# Patient Record
Sex: Male | Born: 1960
Health system: Southern US, Community
[De-identification: ages and names within clinical notes are randomized; demographics above are authoritative.]

## PROBLEM LIST (undated history)

## (undated) DIAGNOSIS — F1020 Alcohol dependence, uncomplicated: Secondary | ICD-10-CM

## (undated) DIAGNOSIS — K7030 Alcoholic cirrhosis of liver without ascites: Secondary | ICD-10-CM

## (undated) DIAGNOSIS — I10 Essential (primary) hypertension: Secondary | ICD-10-CM

## (undated) DIAGNOSIS — M109 Gout, unspecified: Secondary | ICD-10-CM

## (undated) DIAGNOSIS — G621 Alcoholic polyneuropathy: Secondary | ICD-10-CM

## (undated) DIAGNOSIS — Z87891 Personal history of nicotine dependence: Secondary | ICD-10-CM

## (undated) DIAGNOSIS — G4733 Obstructive sleep apnea (adult) (pediatric): Secondary | ICD-10-CM

## (undated) DIAGNOSIS — M1990 Unspecified osteoarthritis, unspecified site: Secondary | ICD-10-CM

## (undated) DIAGNOSIS — F32A Depression, unspecified: Secondary | ICD-10-CM

## (undated) DIAGNOSIS — Z5320 Procedure and treatment not carried out because of patient's decision for unspecified reasons: Secondary | ICD-10-CM

## (undated) DIAGNOSIS — F1010 Alcohol abuse, uncomplicated: Secondary | ICD-10-CM

## (undated) DIAGNOSIS — F329 Major depressive disorder, single episode, unspecified: Secondary | ICD-10-CM

## (undated) DIAGNOSIS — F419 Anxiety disorder, unspecified: Secondary | ICD-10-CM

## (undated) DIAGNOSIS — N183 Chronic kidney disease, stage 3 unspecified: Secondary | ICD-10-CM

## (undated) DIAGNOSIS — R269 Unspecified abnormalities of gait and mobility: Secondary | ICD-10-CM

## (undated) DIAGNOSIS — Z91014 Allergy to mammalian meats: Secondary | ICD-10-CM

## (undated) HISTORY — DX: Personal history of nicotine dependence: Z87.891

## (undated) HISTORY — DX: Alcoholic cirrhosis of liver without ascites: K70.30

## (undated) HISTORY — DX: Obstructive sleep apnea (adult) (pediatric): G47.33

## (undated) HISTORY — DX: Unspecified osteoarthritis, unspecified site: M19.90

## (undated) HISTORY — DX: Allergy to mammalian meats: Z91.014

## (undated) HISTORY — DX: Chronic kidney disease, stage 3 unspecified: N18.30

## (undated) HISTORY — DX: Alcoholic polyneuropathy: G62.1

## (undated) HISTORY — DX: Unspecified abnormalities of gait and mobility: R26.9

## (undated) HISTORY — DX: Procedure and treatment not carried out because of patient's decision for unspecified reasons: Z53.20

## (undated) HISTORY — DX: Alcohol dependence, uncomplicated: F10.20

## (undated) HISTORY — PX: HERNIA REPAIR: SHX51

---

## 2000-05-13 ENCOUNTER — Ambulatory Visit (HOSPITAL_COMMUNITY): Admission: RE | Admit: 2000-05-13 | Discharge: 2000-05-13 | Payer: Self-pay | Admitting: General Surgery

## 2003-07-09 ENCOUNTER — Encounter: Payer: Self-pay | Admitting: Family Medicine

## 2003-07-09 ENCOUNTER — Ambulatory Visit (HOSPITAL_COMMUNITY): Admission: RE | Admit: 2003-07-09 | Discharge: 2003-07-09 | Payer: Self-pay | Admitting: Family Medicine

## 2004-08-05 ENCOUNTER — Other Ambulatory Visit: Payer: Self-pay

## 2007-10-30 ENCOUNTER — Ambulatory Visit: Payer: Self-pay | Admitting: Family Medicine

## 2010-02-21 ENCOUNTER — Ambulatory Visit: Payer: Self-pay | Admitting: Family Medicine

## 2015-06-05 ENCOUNTER — Emergency Department: Payer: BLUE CROSS/BLUE SHIELD

## 2015-06-05 ENCOUNTER — Inpatient Hospital Stay
Admission: EM | Admit: 2015-06-05 | Discharge: 2015-06-06 | DRG: 897 | Disposition: A | Discharge status: Home / Self Care | Payer: BLUE CROSS/BLUE SHIELD | Attending: Internal Medicine | Admitting: Internal Medicine

## 2015-06-05 ENCOUNTER — Encounter: Payer: Self-pay | Admitting: *Deleted

## 2015-06-05 DIAGNOSIS — F17210 Nicotine dependence, cigarettes, uncomplicated: Secondary | ICD-10-CM | POA: Diagnosis present

## 2015-06-05 DIAGNOSIS — I10 Essential (primary) hypertension: Secondary | ICD-10-CM | POA: Diagnosis present

## 2015-06-05 DIAGNOSIS — I160 Hypertensive urgency: Secondary | ICD-10-CM

## 2015-06-05 DIAGNOSIS — Z79899 Other long term (current) drug therapy: Secondary | ICD-10-CM

## 2015-06-05 DIAGNOSIS — F419 Anxiety disorder, unspecified: Secondary | ICD-10-CM | POA: Diagnosis present

## 2015-06-05 DIAGNOSIS — F10939 Alcohol use, unspecified with withdrawal, unspecified: Secondary | ICD-10-CM | POA: Diagnosis present

## 2015-06-05 DIAGNOSIS — I159 Secondary hypertension, unspecified: Secondary | ICD-10-CM

## 2015-06-05 DIAGNOSIS — Z716 Tobacco abuse counseling: Secondary | ICD-10-CM | POA: Diagnosis present

## 2015-06-05 DIAGNOSIS — F10230 Alcohol dependence with withdrawal, uncomplicated: Secondary | ICD-10-CM

## 2015-06-05 DIAGNOSIS — F10231 Alcohol dependence with withdrawal delirium: Principal | ICD-10-CM | POA: Diagnosis present

## 2015-06-05 DIAGNOSIS — E876 Hypokalemia: Secondary | ICD-10-CM | POA: Diagnosis present

## 2015-06-05 DIAGNOSIS — R7989 Other specified abnormal findings of blood chemistry: Secondary | ICD-10-CM | POA: Diagnosis present

## 2015-06-05 DIAGNOSIS — Z9889 Other specified postprocedural states: Secondary | ICD-10-CM | POA: Diagnosis not present

## 2015-06-05 DIAGNOSIS — F10930 Alcohol use, unspecified with withdrawal, uncomplicated: Secondary | ICD-10-CM

## 2015-06-05 DIAGNOSIS — F10239 Alcohol dependence with withdrawal, unspecified: Secondary | ICD-10-CM | POA: Diagnosis present

## 2015-06-05 HISTORY — DX: Essential (primary) hypertension: I10

## 2015-06-05 HISTORY — DX: Anxiety disorder, unspecified: F41.9

## 2015-06-05 LAB — COMPREHENSIVE METABOLIC PANEL
ALBUMIN: 4.7 g/dL (ref 3.5–5.0)
ALK PHOS: 72 U/L (ref 38–126)
ALT: 58 U/L (ref 17–63)
AST: 83 U/L — AB (ref 15–41)
Anion gap: 11 (ref 5–15)
BUN: 15 mg/dL (ref 6–20)
CO2: 25 mmol/L (ref 22–32)
CREATININE: 1.03 mg/dL (ref 0.61–1.24)
Calcium: 8.5 mg/dL — ABNORMAL LOW (ref 8.9–10.3)
Chloride: 104 mmol/L (ref 101–111)
GFR calc Af Amer: >60 mL/min (ref >60)
Glucose, Bld: 95 mg/dL (ref 65–99)
POTASSIUM: 3.1 mmol/L — AB (ref 3.5–5.1)
Sodium: 140 mmol/L (ref 135–145)
Total Bilirubin: 2.2 mg/dL — ABNORMAL HIGH (ref 0.3–1.2)
Total Protein: 7.8 g/dL (ref 6.5–8.1)

## 2015-06-05 LAB — URINE DRUG SCREEN, QUALITATIVE (ARMC ONLY)
AMPHETAMINES, UR SCREEN: NOT DETECTED
Barbiturates, Ur Screen: NOT DETECTED
Benzodiazepine, Ur Scrn: NOT DETECTED
Cannabinoid 50 Ng, Ur ~~LOC~~: NOT DETECTED
Cocaine Metabolite,Ur ~~LOC~~: NOT DETECTED
MDMA (Ecstasy)Ur Screen: NOT DETECTED
Methadone Scn, Ur: NOT DETECTED
OPIATE, UR SCREEN: NOT DETECTED
Phencyclidine (PCP) Ur S: NOT DETECTED
TRICYCLIC, UR SCREEN: NOT DETECTED

## 2015-06-05 LAB — URINALYSIS COMPLETE WITH MICROSCOPIC (ARMC ONLY)
BILIRUBIN URINE: NEGATIVE
Bacteria, UA: NONE SEEN
Glucose, UA: NEGATIVE mg/dL
HGB URINE DIPSTICK: NEGATIVE
Leukocytes, UA: NEGATIVE
Nitrite: NEGATIVE
PH: 7 (ref 5.0–8.0)
Protein, ur: NEGATIVE mg/dL
SPECIFIC GRAVITY, URINE: 1.011 (ref 1.005–1.030)
Squamous Epithelial / LPF: NONE SEEN

## 2015-06-05 LAB — PHOSPHORUS: Phosphorus: 1.3 mg/dL — ABNORMAL LOW (ref 2.5–4.6)

## 2015-06-05 LAB — LIPASE, BLOOD: Lipase: 60 U/L — ABNORMAL HIGH (ref 22–51)

## 2015-06-05 LAB — CBC
HEMATOCRIT: 58 % — AB (ref 40.0–52.0)
HEMOGLOBIN: 20.1 g/dL — AB (ref 13.0–18.0)
MCH: 33.6 pg (ref 26.0–34.0)
MCHC: 34.7 g/dL (ref 32.0–36.0)
MCV: 96.8 fL (ref 80.0–100.0)
Platelets: 160 10*3/uL (ref 150–440)
RBC: 5.99 MIL/uL — ABNORMAL HIGH (ref 4.40–5.90)
RDW: 13.6 % (ref 11.5–14.5)
WBC: 7.3 10*3/uL (ref 3.8–10.6)

## 2015-06-05 LAB — TROPONIN I: Troponin I: <0.03 ng/mL (ref <0.031)

## 2015-06-05 LAB — MAGNESIUM: Magnesium: 1.7 mg/dL (ref 1.7–2.4)

## 2015-06-05 LAB — SALICYLATE LEVEL: Salicylate Lvl: <4 mg/dL (ref 2.8–30.0)

## 2015-06-05 LAB — ETHANOL: ALCOHOL ETHYL (B): 109 mg/dL — AB (ref <5)

## 2015-06-05 LAB — MRSA PCR SCREENING: MRSA BY PCR: NEGATIVE

## 2015-06-05 LAB — ACETAMINOPHEN LEVEL

## 2015-06-05 MED ORDER — SODIUM CHLORIDE 0.9 % IV SOLN
INTRAVENOUS | Status: DC
  Administered 2015-06-05: 09:00:00 via INTRAVENOUS

## 2015-06-05 MED ORDER — LORAZEPAM 2 MG/ML IJ SOLN
1.0000 mg | Freq: Once | INTRAMUSCULAR | Status: AC
  Administered 2015-06-05: 1 mg via INTRAVENOUS
  Filled 2015-06-05: qty 1

## 2015-06-05 MED ORDER — LISINOPRIL-HYDROCHLOROTHIAZIDE 20-12.5 MG PO TABS: 1.0000 | ORAL_TABLET | Freq: Every day | ORAL | Status: DC

## 2015-06-05 MED ORDER — LORAZEPAM 2 MG/ML IJ SOLN
INTRAMUSCULAR | Status: AC
  Administered 2015-06-05: 1 mg via INTRAVENOUS
  Filled 2015-06-05: qty 1

## 2015-06-05 MED ORDER — HYDROCHLOROTHIAZIDE 12.5 MG PO CAPS
12.5000 mg | ORAL_CAPSULE | Freq: Every day | ORAL | Status: DC
  Administered 2015-06-05 – 2015-06-06 (×2): 12.5 mg via ORAL
  Filled 2015-06-05 (×2): qty 1

## 2015-06-05 MED ORDER — LABETALOL HCL 5 MG/ML IV SOLN
20.0000 mg | INTRAVENOUS | Status: DC | PRN
  Administered 2015-06-05 – 2015-06-06 (×4): 20 mg via INTRAVENOUS
  Filled 2015-06-05 (×4): qty 4

## 2015-06-05 MED ORDER — ONDANSETRON HCL 4 MG/2ML IJ SOLN
4.0000 mg | Freq: Four times a day (QID) | INTRAMUSCULAR | Status: DC | PRN
  Administered 2015-06-05: 4 mg via INTRAVENOUS
  Filled 2015-06-05: qty 2

## 2015-06-05 MED ORDER — HYDRALAZINE HCL 20 MG/ML IJ SOLN: 10.0000 mg | Freq: Four times a day (QID) | INTRAMUSCULAR | Status: DC | PRN

## 2015-06-05 MED ORDER — ALBUTEROL SULFATE (2.5 MG/3ML) 0.083% IN NEBU: 2.5000 mg | INHALATION_SOLUTION | RESPIRATORY_TRACT | Status: DC | PRN

## 2015-06-05 MED ORDER — THIAMINE HCL 100 MG/ML IJ SOLN
Freq: Once | INTRAVENOUS | Status: AC
  Administered 2015-06-05: 07:00:00 via INTRAVENOUS
  Filled 2015-06-05: qty 1000

## 2015-06-05 MED ORDER — HEPARIN SODIUM (PORCINE) 5000 UNIT/ML IJ SOLN
5000.0000 [IU] | Freq: Three times a day (TID) | INTRAMUSCULAR | Status: DC
  Administered 2015-06-05 – 2015-06-06 (×4): 5000 [IU] via SUBCUTANEOUS
  Filled 2015-06-05 (×4): qty 1

## 2015-06-05 MED ORDER — LORAZEPAM 2 MG/ML IJ SOLN
1.0000 mg | Freq: Once | INTRAMUSCULAR | Status: AC
  Administered 2015-06-05: 1 mg via INTRAVENOUS

## 2015-06-05 MED ORDER — POTASSIUM CHLORIDE CRYS ER 20 MEQ PO TBCR
40.0000 meq | EXTENDED_RELEASE_TABLET | Freq: Once | ORAL | Status: AC
  Administered 2015-06-05: 40 meq via ORAL
  Filled 2015-06-05: qty 2

## 2015-06-05 MED ORDER — HYDRALAZINE HCL 20 MG/ML IJ SOLN
10.0000 mg | Freq: Once | INTRAMUSCULAR | Status: AC
  Administered 2015-06-05: 10 mg via INTRAVENOUS

## 2015-06-05 MED ORDER — HYDRALAZINE HCL 20 MG/ML IJ SOLN
INTRAMUSCULAR | Status: AC
  Administered 2015-06-05: 10 mg via INTRAVENOUS
  Filled 2015-06-05: qty 1

## 2015-06-05 MED ORDER — ACETAMINOPHEN 325 MG PO TABS
650.0000 mg | ORAL_TABLET | Freq: Four times a day (QID) | ORAL | Status: DC | PRN
  Administered 2015-06-05 – 2015-06-06 (×2): 650 mg via ORAL
  Filled 2015-06-05 (×2): qty 2

## 2015-06-05 MED ORDER — CLONIDINE HCL 0.1 MG PO TABS
0.1000 mg | ORAL_TABLET | Freq: Once | ORAL | Status: AC
  Administered 2015-06-05: 0.1 mg via ORAL
  Filled 2015-06-05: qty 1

## 2015-06-05 MED ORDER — THIAMINE HCL 100 MG/ML IJ SOLN
Freq: Once | INTRAVENOUS | Status: AC
  Administered 2015-06-05: 09:00:00 via INTRAVENOUS
  Filled 2015-06-05 (×2): qty 1000

## 2015-06-05 MED ORDER — ONDANSETRON HCL 4 MG PO TABS: 4.0000 mg | ORAL_TABLET | Freq: Four times a day (QID) | ORAL | Status: DC | PRN

## 2015-06-05 MED ORDER — NITROGLYCERIN 2 % TD OINT
1.0000 [in_us] | TOPICAL_OINTMENT | Freq: Four times a day (QID) | TRANSDERMAL | Status: DC
  Administered 2015-06-05 – 2015-06-06 (×5): 1 [in_us] via TOPICAL
  Filled 2015-06-05 (×5): qty 1

## 2015-06-05 MED ORDER — ACETAMINOPHEN 650 MG RE SUPP: 650.0000 mg | Freq: Four times a day (QID) | RECTAL | Status: DC | PRN

## 2015-06-05 MED ORDER — LORAZEPAM 2 MG/ML IJ SOLN
0.0000 mg | Freq: Four times a day (QID) | INTRAMUSCULAR | Status: DC
  Administered 2015-06-05: 1 mg via INTRAVENOUS
  Filled 2015-06-05: qty 1

## 2015-06-05 MED ORDER — CLONIDINE HCL 0.1 MG PO TABS
0.1000 mg | ORAL_TABLET | Freq: Two times a day (BID) | ORAL | Status: DC
  Administered 2015-06-05 – 2015-06-06 (×3): 0.1 mg via ORAL
  Filled 2015-06-05 (×3): qty 1

## 2015-06-05 MED ORDER — LISINOPRIL 20 MG PO TABS
20.0000 mg | ORAL_TABLET | Freq: Every day | ORAL | Status: DC
  Administered 2015-06-05 – 2015-06-06 (×2): 20 mg via ORAL
  Filled 2015-06-05 (×2): qty 1

## 2015-06-05 MED ORDER — SODIUM CHLORIDE 0.9 % IV BOLUS (SEPSIS)
500.0000 mL | Freq: Once | INTRAVENOUS | Status: AC
  Administered 2015-06-05: 500 mL via INTRAVENOUS

## 2015-06-05 MED ORDER — ONDANSETRON HCL 4 MG/2ML IJ SOLN
4.0000 mg | Freq: Once | INTRAMUSCULAR | Status: AC
  Administered 2015-06-05: 4 mg via INTRAVENOUS
  Filled 2015-06-05: qty 2

## 2015-06-05 NOTE — ED Notes (Signed)
Admitting provider at bedside.

## 2015-06-05 NOTE — ED Notes (Signed)
Pt presents w/ c/o generally feeling back since yesterday morning. Pt is very shaky and flushed. Pt admits to not taking meds for his HTN this week. Pt admits to daily, excessive ETOH use. Pt c/o dizziness and nausea. Pt smells of ETOH but states he has not had any to drink since noon yesterday.

## 2015-06-05 NOTE — ED Provider Notes (Signed)
[facility] Emergency Department Provider Note  ____________________________________________  Time seen: Approximately 3:41 AM  I have reviewed the triage vital signs and the nursing notes.   HISTORY  Chief Complaint Hypertension and Alcohol Intoxication  History obtained by patient and spouse  HPI Kenneth Trevino is a 54 y.o. male who presents to the ED from home with complaints of generalized myalgia, feeling shaky and flushed, elevated blood pressure. Patient states "I just feel bad" times several days. Admits to not taking his blood pressure meds this week. Complains of dizziness and nausea. Admits to daily alcohol use; last use yesterday afternoon. Does not feel his alcohol use is an issue;however, spouse states over the past 2 weeks that patient has had labile mood swings and seems depressed. Patient denies SI/HI/AH/VH. Denies history of DTs. Admits to increased daily stressors. Complains of gradual onset, generalized headache, blurry vision, generalized malaise and nausea. Denies chest pain, shortness of breath, vomiting, diarrhea.   Past Medical History  Diagnosis Date  . Hypertension   . Anxiety     There are no active problems to display for this patient.   Past Surgical History  Procedure Laterality Date  . Hernia repair      Current Outpatient Rx  Name  Route  Sig  Dispense  Refill  . cloNIDine (CATAPRES) 0.1 MG tablet   Oral   Take 0.1 mg by mouth 2 (two) times daily.         Marland Kitchen. lisinopril-hydrochlorothiazide (PRINZIDE,ZESTORETIC) 20-12.5 MG per tablet   Oral   Take 1 tablet by mouth daily.           Allergies Review of patient's allergies indicates no known allergies.  History reviewed. No pertinent family history.  Social History History  Substance Use Topics  . Smoking status: Current Some Day Smoker    Types: Cigarettes  . Smokeless tobacco: Former NeurosurgeonUser  . Alcohol Use: Yes     Comment: daily, consumes 2 half-gallons  of liquor a week  Denies illicit drug use  Review of Systems Constitutional: No fever/chills. Positive for generalized malaise. Eyes: Positive for visual changes. ENT: No sore throat. Cardiovascular: Denies chest pain. Respiratory: Denies shortness of breath. Gastrointestinal: No abdominal pain.  No nausea, no vomiting.  No diarrhea.  No constipation. Genitourinary: Negative for dysuria. Musculoskeletal: Negative for back pain. Skin: Negative for rash. Neurological: Positive for headaches. Negative for focal weakness or numbness. Psychiatric:Positive for increased anxiety and stressors.  10-point ROS otherwise negative.  ____________________________________________   PHYSICAL EXAM:  VITAL SIGNS: ED Triage Vitals  Enc Vitals Group     BP 06/05/15 0256 190/105 mmHg     Pulse Rate 06/05/15 0256 99     Resp 06/05/15 0256 20     Temp 06/05/15 0256 97.6 F (36.4 C)     Temp Source 06/05/15 0256 Oral     SpO2 06/05/15 0256 95 %     Weight 06/05/15 0256 235 lb (106.595 kg)     Height 06/05/15 0256 6' (1.829 m)     Head Cir --      Peak Flow --      Pain Score 06/05/15 0257 5     Pain Loc --      Pain Edu? --      Excl. in GC? --     Constitutional: Alert and oriented. Well appearing and in mild acute distress. Eyes: Conjunctivae are normal. PERRL. EOMI. Head: Atraumatic. Nose: No congestion/rhinnorhea. Mouth/Throat: Mucous membranes are moist.  Oropharynx  non-erythematous. Neck: No stridor.   Cardiovascular: Normal rate, regular rhythm. Grossly normal heart sounds.  Good peripheral circulation. Respiratory: Normal respiratory effort.  No retractions. Lungs CTAB. Gastrointestinal: Soft and nontender. No distention. No abdominal bruits. No CVA tenderness. Musculoskeletal: No lower extremity tenderness nor edema.  No joint effusions. Neurologic:  Normal speech and language. No gross focal neurologic deficits are appreciated. No gait instability. Skin:  Skin is warm, dry  and intact. No rash noted. Psychiatric: Mood and affect are flat. Speech and behavior are normal.  ____________________________________________   LABS (all labs ordered are listed, but only abnormal results are displayed)  Labs Reviewed  COMPREHENSIVE METABOLIC PANEL - Abnormal; Notable for the following:    Potassium 3.1 (*)    Calcium 8.5 (*)    AST 83 (*)    Total Bilirubin 2.2 (*)    All other components within normal limits  ETHANOL - Abnormal; Notable for the following:    Alcohol, Ethyl (B) 109 (*)    All other components within normal limits  ACETAMINOPHEN LEVEL - Abnormal; Notable for the following:    Acetaminophen (Tylenol), Serum <10 (*)    All other components within normal limits  CBC - Abnormal; Notable for the following:    RBC 5.99 (*)    Hemoglobin 20.1 (*)    HCT 58.0 (*)    All other components within normal limits  LIPASE, BLOOD - Abnormal; Notable for the following:    Lipase 60 (*)    All other components within normal limits  SALICYLATE LEVEL  TROPONIN I  URINE DRUG SCREEN, QUALITATIVE ([facility] ONLY)  CBG MONITORING, ED   ____________________________________________  EKG  ED ECG REPORT I, Kristain Hu J, the attending physician, personally viewed and interpreted this ECG.   Date: 06/05/2015  EKG Time: 0254  Rate: 96  Rhythm: normal EKG, normal sinus rhythm  Axis: Normal  Intervals:none  ST&T Change: Nonspecific  ____________________________________________  RADIOLOGY  CT head without contrast interpreted per Dr. Andria Meuse: No acute intracranial abnormalities. ____________________________________________   PROCEDURES  Procedure(s) performed: None  Critical Care performed:  CRITICAL CARE Performed by: Irean Hong   Total critical care time: 30 minutes  Critical care time was exclusive of separately billable procedures and treating other patients.  Critical care was necessary to treat or prevent imminent or life-threatening  deterioration.  Critical care was time spent personally by me on the following activities: development of treatment plan with patient and/or surrogate as well as nursing, discussions with consultants, evaluation of patient's response to treatment, examination of patient, obtaining history from patient or surrogate, ordering and performing treatments and interventions, ordering and review of laboratory studies, ordering and review of radiographic studies, pulse oximetry and re-evaluation of patient's condition.  ____________________________________________   INITIAL IMPRESSION / ASSESSMENT AND PLAN / ED COURSE  Pertinent labs & imaging results that were available during my care of the patient were reviewed by me and considered in my medical decision making (see chart for details).  54 year old male who presents with elevated blood pressure, generalized malaise, headache associated with blurry vision. I had a frank discussion with the patient and his wife who feels he warrants an evaluation with behavioral medicine. Patient is agreeable to a TTS consultation while he is in the emergency department. Will administer IV Ativan to help with anxiety as well as blood pressure. Obtain noncontrast CT head to evaluate for intracranial hemorrhage. Await screening laboratory and urinalysis results.  ----------------------------------------- 5:34 AM on 06/05/2015 -----------------------------------------  Patient was seen  by TTS who provided referrals for the patient and his spouse. No improvement after IV Ativan. Patient is still extremely hypertensive. Patient did state the Ativan did help "calm his nerves" a little bit. Will repeat IV bolus of Ativan as well as IV hydralazine for blood pressure control.  ----------------------------------------- 6:20 AM on 06/05/2015 -----------------------------------------  Blood pressure initially down to 193/103, but has crept back up. Current BP 211/117. Feel  patient's symptoms and extremely elevated blood pressure likely a combination of alcohol withdrawal along with hypertensive urgency. Will continue with IV Ativan and antihypertensives. Will discuss with hospitalist for evaluation in the ED for hospital admission.   ____________________________________________   FINAL CLINICAL IMPRESSION(S) / ED DIAGNOSES  Final diagnoses:  Secondary hypertension, unspecified  Alcohol withdrawal, uncomplicated  Hypertensive urgency      Irean Hong, MD 06/05/15 1610

## 2015-06-05 NOTE — BH Assessment (Signed)
Assessment Note  Kenneth Trevino is an 54 y.o. male who presents to ED with c/o of high blood pressure. Pt states that has not taken his blood pressure medication for the past 7 days. Pt denies SI/HI, as well as any self-injurious behaviors, hallucinations and delusions. Pt. denies any history of abuse.   Pt. was somewhat defensive and evasive when TTS inquired about alcohol usage expressing "everyone thinks because you drink that that causes all your problems". Pt. did report that hey typically drinks a 5th of liquor a day "when I can get away with it". Pt. reported last alcohol consumption to be "at dinner" (5th of vodka).   Pt. reports that he was diagnosed with anxiety 5 years ago for which he was prescribed Paxil. Pt. shared that the Paxil worked well however, it had "too many side effects" so he discontinued use- no subsequent medications prescribed. Pt. reports family history of depression.     Pt explained that his girlfriend told him that he needs to speak with someone because she believes he is depressed. Girlfriend reported observed symptoms of depression to be mood swings, loss of interest and difficulty sleeping. Pt. agreed with girlfriend adding decrease in motivation and stating "I've lost my mojo", "I just feel sorry".  Pt expressed that he wanted medication for depression.   Per Dr.Sung pt does not meet criteria for inpatient hospitalization. TTS provided psychoeducation, as well as community psyc. and OPT resources.   Axis I: Generalized Anxiety Disorder  Past Medical History:  Past Medical History  Diagnosis Date  . Hypertension   . Anxiety     Past Surgical History  Procedure Laterality Date  . Hernia repair      Family History: History reviewed. No pertinent family history.  Social History:  reports that he has been smoking Cigarettes.  He has quit using smokeless tobacco. He reports that he drinks alcohol. He reports that he does not use illicit drugs.  Additional  Social History:  Alcohol / Drug Use History of alcohol / drug use?: Yes Longest period of sobriety (when/how long): Not Provided Substance #1 1 - Age of First Use: Not Provided 1 - Amount (size/oz): 5th 1 - Frequency: Daily 1 - Duration: Not Provided 1 - Last Use / Amount: "at dinner" 5th  CIWA: CIWA-Ar BP: (!) 190/105 mmHg Pulse Rate: 99 COWS:    Allergies: No Known Allergies  Home Medications:  (Not in a hospital admission)  OB/GYN Status:  No LMP for male patient.  General Assessment Data [facility] TTS Assessment: In system Is this a Tele or Face-to-Face Assessment?: Face-to-Face Is this an Initial Assessment or a Re-assessment for this encounter?: Initial Assessment Marital status: Single Maiden name: Not Applicable Living Arrangements: Spouse/significant other Can pt return to current living arrangement?: Yes Admission Status: Voluntary Is patient capable of signing voluntary admission?: Yes Referral Source: Self/Family/Friend Insurance type: BCBS     Crisis Care Plan Living Arrangements: Spouse/significant other Name of Psychiatrist: None Name of Therapist: None  Education Status Is patient currently in school?: No Highest grade of school patient has completed: 12th Name of school: Not Applicable Contact person: Not Applicable  Risk to self with the past 6 months Suicidal Ideation: No-Not Currently/Within Last 6 Months Has patient been a risk to self within the past 6 months prior to admission? : No Suicidal Intent: No-Not Currently/Within Last 6 Months Has patient had any suicidal intent within the past 6 months prior to admission? : No Is  patient at risk for suicide?: No Suicidal Plan?: No-Not Currently/Within Last 6 Months Has patient had any suicidal plan within the past 6 months prior to admission? : No Access to Means: No What has been your use of drugs/alcohol within the last 12 months?: 5th Vodka daily on average Previous  Attempts/Gestures: No Other Self Harm Risks: no Intentional Self Injurious Behavior: None Family Suicide History: No Persecutory voices/beliefs?: No Depression: Yes Depression Symptoms: Loss of interest in usual pleasures, Feeling worthless/self pity, Insomnia, Feeling angry/irritable Substance abuse history and/or treatment for substance abuse?: No (No hx of treatment) Suicide prevention information given to non-admitted patients: Not applicable  Risk to Others within the past 6 months Homicidal Ideation: No Does patient have any lifetime risk of violence toward others beyond the six months prior to admission? : No Thoughts of Harm to Others: No Current Homicidal Intent: No Current Homicidal Plan: No Access to Homicidal Means: No History of harm to others?: No Does patient have access to weapons?: No Criminal Charges Pending?: No Does patient have a court date: No Is patient on probation?: No  Psychosis Hallucinations: None noted Delusions: None noted  Mental Status Report Appearance/Hygiene: Disheveled Eye Contact: Good Motor Activity: Unremarkable Speech: Unremarkable Level of Consciousness: Alert Mood: Apprehensive (apprehensive in regards to inquries concerning alcohol use) Affect: Appropriate to circumstance Anxiety Level: Minimal Thought Processes: Relevant, Coherent Judgement: Partial Orientation: Person, Place, Time, Situation  Cognitive Functioning Concentration: Normal Memory: Recent Intact, Remote Intact Insight: Fair     Prior Inpatient Therapy Prior Inpatient Therapy: No  Prior Outpatient Therapy Prior Outpatient Therapy: No Does patient have an ACCT team?: No Does patient have Intensive In-House Services?  : No Does patient have [facility] services? : No Does patient have P4CC services?: No          Abuse/Neglect Assessment (Assessment to be complete while patient is alone) Physical Abuse: Denies Verbal Abuse: Denies Sexual Abuse:  Denies Exploitation of patient/patient's resources: Denies Self-Neglect: Denies Values / Beliefs Cultural Requests During Hospitalization: None Spiritual Requests During Hospitalization: None Consults Spiritual Care Consult Needed: No Social Work Consult Needed: No Merchant navy officer (For Healthcare) Does patient have an advance directive?: No Would patient like information on creating an advanced directive?: Yes English as a second language teacher given          Disposition:  Disposition Initial Assessment Completed for this Encounter: Yes Disposition of Patient: Outpatient treatment  On Site Evaluation by:   Reviewed with Physician:    Kenneth Trevino 06/05/2015 5:38 AM

## 2015-06-05 NOTE — H&P (Signed)
[facility] - [place] at [facility]   PATIENT NAME: Kenneth Trevino    MR#:  [id number]  DATE OF BIRTH:  07-22-1961  DATE OF ADMISSION:  06/05/2015  PRIMARY CARE PHYSICIAN: Marin Comment, FNP   REQUESTING/REFERRING PHYSICIAN: Irean Hong, MD  CHIEF COMPLAINT:   Chief Complaint  Patient presents with  . Hypertension  . Alcohol Intoxication   hypertension, headache and blurry vision since yesterday. The patient presents to the ED from home due to headache, blurry vision, myalgia, feeling shaky and flushed. He also complains of the dizziness and the nausea. The patient also complains of high blood pressure at home. He has not taken hypertension medication for 1 week. Over the past 2 weeks that patient has had labile mood swings and seems depressed. He denies syncope or loss of consciousness and denies any history of alcohol withdrawal. His blood pressure is more than 200/100 the ED. He was treated with labetalol and Ativan, but the blood pressure is still 215/125 just now. CAT scan of head is negative.  HISTORY OF PRESENT ILLNESS:  Kenneth Trevino  is a 54 y.o. male with a known history of hypertension, alcohol abuse and anxiety.   PAST MEDICAL HISTORY:   Past Medical History  Diagnosis Date  . Hypertension   . Anxiety     PAST SURGICAL HISTORY:   Past Surgical History  Procedure Laterality Date  . Hernia repair      SOCIAL HISTORY:   History  Substance Use Topics  . Smoking status: Current Some Day Smoker    Types: Cigarettes  . Smokeless tobacco: Former Neurosurgeon  . Alcohol Use: Yes     Comment: daily, consumes 2 half-gallons of liquor a week    FAMILY HISTORY:  History reviewed. No pertinent family history.  DRUG ALLERGIES:  No Known Allergies  REVIEW OF SYSTEMS:  CONSTITUTIONAL: No fever, fatigue or weakness. Has headache and dizziness. EYES: Positive blurred vision but no double vision.  EARS, NOSE, AND THROAT: No tinnitus or ear pain.   RESPIRATORY: No cough, shortness of breath, wheezing or hemoptysis.  CARDIOVASCULAR: No chest pain, orthopnea, edema.  GASTROINTESTINAL: has nausea, but no vomiting, diarrhea or abdominal pain.  GENITOURINARY: No dysuria, hematuria.  ENDOCRINE: No polyuria, nocturia,  HEMATOLOGY: No anemia, easy bruising or bleeding SKIN: No rash or lesion. MUSCULOSKELETAL: No joint pain or arthritis.   NEUROLOGIC: No tingling, numbness, weakness. Headache, dizziness and tremor.  PSYCHIATRY: has anxiety but no depression.   MEDICATIONS AT HOME:   Prior to Admission medications   Medication Sig Start Date End Date Taking? Authorizing Provider  cloNIDine (CATAPRES) 0.1 MG tablet Take 0.1 mg by mouth 2 (two) times daily.   Yes Historical Provider, MD  lisinopril-hydrochlorothiazide (PRINZIDE,ZESTORETIC) 20-12.5 MG per tablet Take 1 tablet by mouth daily.   Yes Historical Provider, MD      VITAL SIGNS:  Blood pressure 195/109, pulse 89, temperature 97.6 F (36.4 C), temperature source Oral, resp. rate 18, height 6' (1.829 m), weight 106.595 kg (235 lb), SpO2 97 %.  PHYSICAL EXAMINATION:  GENERAL:  54 y.o.-year-old patient lying in the bed with no acute distress.  EYES: Pupils equal, round, reactive to light and accommodation. No scleral icterus. Extraocular muscles intact.  HEENT: Head atraumatic, normocephalic. Oropharynx and nasopharynx clear.  NECK:  Supple, no jugular venous distention. No thyroid enlargement, no tenderness.  LUNGS: Normal breath sounds bilaterally, no wheezing, rales,rhonchi or crepitation. No use of accessory muscles of respiration.  CARDIOVASCULAR: S1, S2 normal.  No murmurs, rubs, or gallops.  ABDOMEN: Soft, nontender, nondistended. Bowel sounds present. No organomegaly or mass.  EXTREMITIES: No pedal edema, cyanosis, or clubbing.  NEUROLOGIC: Cranial nerves II through XII are intact. Muscle strength 5/5 in all extremities. Sensation intact. Gait not checked. Mild tremors of  hands. PSYCHIATRIC: The patient is alert and oriented x 3. Looks anxious.  SKIN: No obvious rash, lesion, or ulcer.   LABORATORY PANEL:   CBC  Recent Labs Lab 06/05/15 0310  WBC 7.3  HGB 20.1*  HCT 58.0*  PLT 160   ------------------------------------------------------------------------------------------------------------------  Chemistries   Recent Labs Lab 06/05/15 0310  NA 140  K 3.1*  CL 104  CO2 25  GLUCOSE 95  BUN 15  CREATININE 1.03  CALCIUM 8.5*  AST 83*  ALT 58  ALKPHOS 72  BILITOT 2.2*   ------------------------------------------------------------------------------------------------------------------  Cardiac Enzymes  Recent Labs Lab 06/05/15 0310  TROPONINI <0.03   ------------------------------------------------------------------------------------------------------------------  RADIOLOGY:  Ct Head Wo Contrast  06/05/2015   CLINICAL DATA:  Hypertension, headache, blurred vision, and slurred speech since 2 a.m.  EXAM: CT HEAD WITHOUT CONTRAST  TECHNIQUE: Contiguous axial images were obtained from the base of the skull through the vertex without intravenous contrast.  COMPARISON:  02/01/2013  FINDINGS: Ventricles and sulci are symmetrical. No mass effect or midline shift. No abnormal extra-axial fluid collections. Gray-white matter junctions are distinct. Basal cisterns are not effaced. No evidence of acute intracranial hemorrhage. No depressed skull fractures. Visualized paranasal sinuses and mastoid air cells are not opacified.  IMPRESSION: No acute intracranial abnormalities.   Electronically Signed   By: Burman NievesWilliam  Stevens M.D.   On: 06/05/2015 04:40    EKG:   Orders placed or performed during the hospital encounter of 06/05/15  . ED EKG  . ED EKG  . EKG 12-Lead  . EKG 12-Lead    IMPRESSION AND PLAN:   Alcohol withdrawal and DTs Malignant hypertension Hypokalemia Abnormal liver function test, possible due to alcohol. Alcohol abuse Tobacco  abuse  The patient will be admitted to stepdown unit. I will start CIWA protocol with banana bag IV and Ativan when necessary. For malignant hypertension, I will continue patient home medication clonidine, lisinopril and HCTZ. Start IV hydralazine and labetalol when necessary. I will give potassium supplement and check magnesium level. Smoking cessation was counseled for 3 minutes. I will give nicotine patch.   All the records are reviewed and case discussed with ED provider. Management plans discussed with the patient, family and they are in agreement.  CODE STATUS: Full code  TOTAL CRITICAL TIME TAKING CARE OF THIS PATIENT: 55 minutes.    Shaune Pollackhen, Arjuna Doeden M.D on 06/05/2015 at 7:37 AM  Between 7am to 6pm - Pager - [phone]  After 6pm go to [email or web address] - password [email or web address] [facility]  [facility]  Office  [phone]  CC: Primary care physician; Marin CommentWELLS, CHERYL, FNP

## 2015-06-06 LAB — CBC
HCT: 49.8 % (ref 40.0–52.0)
HEMOGLOBIN: 17.2 g/dL (ref 13.0–18.0)
MCH: 33.8 pg (ref 26.0–34.0)
MCHC: 34.6 g/dL (ref 32.0–36.0)
MCV: 97.7 fL (ref 80.0–100.0)
PLATELETS: 114 10*3/uL — AB (ref 150–440)
RBC: 5.09 MIL/uL (ref 4.40–5.90)
RDW: 13.5 % (ref 11.5–14.5)
WBC: 5.8 10*3/uL (ref 3.8–10.6)

## 2015-06-06 LAB — BASIC METABOLIC PANEL
Anion gap: 9 (ref 5–15)
BUN: 14 mg/dL (ref 6–20)
CALCIUM: 8.4 mg/dL — AB (ref 8.9–10.3)
CO2: 27 mmol/L (ref 22–32)
CREATININE: 0.83 mg/dL (ref 0.61–1.24)
Chloride: 104 mmol/L (ref 101–111)
GFR calc non Af Amer: >60 mL/min (ref >60)
Glucose, Bld: 106 mg/dL — ABNORMAL HIGH (ref 65–99)
Potassium: 2.9 mmol/L — CL (ref 3.5–5.1)
Sodium: 140 mmol/L (ref 135–145)

## 2015-06-06 LAB — MAGNESIUM: Magnesium: 1.5 mg/dL — ABNORMAL LOW (ref 1.7–2.4)

## 2015-06-06 LAB — GLUCOSE, CAPILLARY: Glucose-Capillary: 94 mg/dL (ref 65–99)

## 2015-06-06 MED ORDER — AMLODIPINE BESYLATE 10 MG PO TABS: 10.0000 mg | ORAL_TABLET | Freq: Every day | ORAL | Status: DC

## 2015-06-06 MED ORDER — POTASSIUM CHLORIDE 20 MEQ/15ML (10%) PO SOLN
60.0000 meq | Freq: Once | ORAL | Status: AC
  Administered 2015-06-06: 60 meq via ORAL
  Filled 2015-06-06: qty 45

## 2015-06-06 MED ORDER — POTASSIUM CHLORIDE CRYS ER 20 MEQ PO TBCR: 60.0000 meq | EXTENDED_RELEASE_TABLET | Freq: Once | ORAL | Status: DC

## 2015-06-06 MED ORDER — ALPRAZOLAM 0.25 MG PO TABS: 0.2500 mg | ORAL_TABLET | Freq: Three times a day (TID) | ORAL | Status: DC | PRN

## 2015-06-06 MED ORDER — MAGNESIUM OXIDE 400 (241.3 MG) MG PO TABS: 800.0000 mg | ORAL_TABLET | Freq: Once | ORAL | Status: DC

## 2015-06-06 MED ORDER — POTASSIUM CHLORIDE CRYS ER 20 MEQ PO TBCR
40.0000 meq | EXTENDED_RELEASE_TABLET | Freq: Two times a day (BID) | ORAL | Status: DC
  Administered 2015-06-06: 40 meq via ORAL
  Filled 2015-06-06: qty 2

## 2015-06-06 MED ORDER — AMLODIPINE BESYLATE 10 MG PO TABS
10.0000 mg | ORAL_TABLET | Freq: Every day | ORAL | Status: DC
  Administered 2015-06-06: 10 mg via ORAL
  Filled 2015-06-06: qty 1

## 2015-06-06 MED ORDER — POTASSIUM CHLORIDE 10 MEQ/100ML IV SOLN
10.0000 meq | INTRAVENOUS | Status: DC
  Administered 2015-06-06: 10 meq via INTRAVENOUS
  Filled 2015-06-06 (×4): qty 100

## 2015-06-06 NOTE — Care Management (Signed)
Patient CIWA score since midnight between 1-3.Marland Kitchen.  Referral CSW for ETOH.  Blood pressure is stabilizing

## 2015-06-06 NOTE — Progress Notes (Signed)
Patient is discharge home in a stable condition with bp improving , summary and f/u care given to pt's and wife , verbalized understanding , left with wife

## 2015-06-06 NOTE — Progress Notes (Signed)
Dr. Sheryle Hailiamond notified patient is unable to tolerate IV potassium, he did take po potassium. Awaiting orders.

## 2015-06-06 NOTE — Progress Notes (Signed)
Dr. Sheryle Hailiamond notified of critical lab value for potassium and elevated B/p's over night. New orders for potassium to be replenished.

## 2015-06-06 NOTE — Discharge Instructions (Signed)
Low sodium, low fat diet. Activity as tolerated. Outpatient detox. Smoking cessation.

## 2015-06-06 NOTE — Progress Notes (Signed)
Skin assessment verified by AS, RN. Patients skin is warm and dry with no issues noted.

## 2015-06-06 NOTE — Discharge Summary (Signed)
[facility] [place] at [facility]   PATIENT NAME: Kenneth Trevino    MR#:  [id number]  DATE OF BIRTH:  07/03/1961  DATE OF ADMISSION:  06/05/2015 ADMITTING PHYSICIAN: Shaune Pollack, MD  DATE OF DISCHARGE: 06/06/2015 PRIMARY CARE PHYSICIAN: Marin Comment, FNP    ADMISSION DIAGNOSIS:  Hypertensive urgency [I10] Alcohol withdrawal, uncomplicated [F10.230] Secondary hypertension, unspecified [I15.9]   DISCHARGE DIAGNOSIS:   Alcohol withdrawal and DTs Malignant hypertension Hypokalemia Abnormal liver function test, possible due to alcohol. Alcohol abuse SECONDARY DIAGNOSIS:   Past Medical History  Diagnosis Date  . Hypertension   . Anxiety     HOSPITAL COURSE:   Alcohol withdrawal.  The patient has been treated with a CIWA protocol and banana bag. His symptoms has much improved. He only has mild tremor and anxiety. He denies any headache.  Malignant hypertension. He has been treated with lisinopril, HCTZ and clonidine. He is also treated with the IV labetalol and hydralazine when necessary. In addition he was treated with the Nitropatch. Norvasc is added this morning in the Nitropatch was removed. Vision to her blood pressure is much better controlled.  Hypokalemia. He has been treated with a potassium supplement. Potassium is 2.9 today he was given potassium supplements this morning and the need to follow-up BMP as outpatient.  Hypomagnesemia. Patient is treated with the magnesium oxide by mouth. Abnormal liver function test, possible due to alcohol. Follow-up CMP as outpatient.  DISCHARGE CONDITIONS:   Stable. Discharge to home today.  CONSULTS OBTAINED:  Treatment Team:  Shaune Pollack, MD  DRUG ALLERGIES:  No Known Allergies  DISCHARGE MEDICATIONS:   Current Discharge Medication List    START taking these medications   Details  ALPRAZolam (XANAX) 0.25 MG tablet Take 1 tablet (0.25 mg total) by mouth 3 (three) times daily as needed for  anxiety. Qty: 30 tablet, Refills: 0    amLODipine (NORVASC) 10 MG tablet Take 1 tablet (10 mg total) by mouth daily. Qty: 30 tablet, Refills: 0      CONTINUE these medications which have NOT CHANGED   Details  cloNIDine (CATAPRES) 0.1 MG tablet Take 0.1 mg by mouth 2 (two) times daily.    lisinopril-hydrochlorothiazide (PRINZIDE,ZESTORETIC) 20-12.5 MG per tablet Take 1 tablet by mouth daily.    testosterone cypionate (DEPOTESTOSTERONE CYPIONATE) 200 MG/ML injection Inject 100 mg into the muscle every 14 (fourteen) days.         DISCHARGE INSTRUCTIONS:    If you experience worsening of your admission symptoms, develop shortness of breath, life threatening emergency, suicidal or homicidal thoughts you must seek medical attention immediately by calling 911 or calling your MD immediately  if symptoms less severe.  You Must read complete instructions/literature along with all the possible adverse reactions/side effects for all the Medicines you take and that have been prescribed to you. Take any new Medicines after you have completely understood and accept all the possible adverse reactions/side effects.   Please note  You were cared for by a hospitalist during your hospital stay. If you have any questions about your discharge medications or the care you received while you were in the hospital after you are discharged, you can call the unit and asked to speak with the hospitalist on call if the hospitalist that took care of you is not available. Once you are discharged, your primary care physician will handle any further medical issues. Please note that NO REFILLS for any discharge medications will be authorized once you are discharged, as  it is imperative that you return to your primary care physician (or establish a relationship with a primary care physician if you do not have one) for your aftercare needs so that they can reassess your need for medications and monitor your lab  values.    Today   SUBJECTIVE   Anxious.   VITAL SIGNS:  Blood pressure 156/84, pulse 65, temperature 97.5 F (36.4 C), temperature source Oral, resp. rate 18, height 6' (1.829 m), weight 106.595 kg (235 lb), SpO2 99 %.  I/O:   Intake/Output Summary (Last 24 hours) at 06/06/15 1317 Last data filed at 06/06/15 1100  Gross per 24 hour  Intake 591.67 ml  Output      0 ml  Net 591.67 ml    PHYSICAL EXAMINATION:  GENERAL:  54 y.o.-year-old patient lying in the bed with no acute distress.  EYES: Pupils equal, round, reactive to light and accommodation. No scleral icterus. Extraocular muscles intact.  HEENT: Head atraumatic, normocephalic. Oropharynx and nasopharynx clear.  NECK:  Supple, no jugular venous distention. No thyroid enlargement, no tenderness.  LUNGS: Normal breath sounds bilaterally, no wheezing, rales,rhonchi or crepitation. No use of accessory muscles of respiration.  CARDIOVASCULAR: S1, S2 normal. No murmurs, rubs, or gallops.  ABDOMEN: Soft, non-tender, non-distended. Bowel sounds present. No organomegaly or mass.  EXTREMITIES: No pedal edema, cyanosis, or clubbing.  NEUROLOGIC: Cranial nerves II through XII are intact. Muscle strength 5/5 in all extremities. Sensation intact. Gait not checked.  PSYCHIATRIC: The patient is alert and oriented x 3.  SKIN: No obvious rash, lesion, or ulcer.   DATA REVIEW:   CBC  Recent Labs Lab 06/06/15 0430  WBC 5.8  HGB 17.2  HCT 49.8  PLT 114*    Chemistries   Recent Labs Lab 06/05/15 0310 06/06/15 0430  NA 140 140  K 3.1* 2.9*  CL 104 104  CO2 25 27  GLUCOSE 95 106*  BUN 15 14  CREATININE 1.03 0.83  CALCIUM 8.5* 8.4*  MG 1.7 1.5*  AST 83*  --   ALT 58  --   ALKPHOS 72  --   BILITOT 2.2*  --     Cardiac Enzymes  Recent Labs Lab 06/05/15 0310  TROPONINI <0.03    Microbiology Results  Results for orders placed or performed during the hospital encounter of 06/05/15  MRSA PCR Screening      Status: None   Collection Time: 06/05/15  9:30 AM  Result Value Ref Range Status   MRSA by PCR NEGATIVE NEGATIVE Final    Comment:        The GeneXpert MRSA Assay (FDA approved for NASAL specimens only), is one component of a comprehensive MRSA colonization surveillance program. It is not intended to diagnose MRSA infection nor to guide or monitor treatment for MRSA infections.     RADIOLOGY:  Ct Head Wo Contrast  06/05/2015   CLINICAL DATA:  Hypertension, headache, blurred vision, and slurred speech since 2 a.m.  EXAM: CT HEAD WITHOUT CONTRAST  TECHNIQUE: Contiguous axial images were obtained from the base of the skull through the vertex without intravenous contrast.  COMPARISON:  02/01/2013  FINDINGS: Ventricles and sulci are symmetrical. No mass effect or midline shift. No abnormal extra-axial fluid collections. Gray-white matter junctions are distinct. Basal cisterns are not effaced. No evidence of acute intracranial hemorrhage. No depressed skull fractures. Visualized paranasal sinuses and mastoid air cells are not opacified.  IMPRESSION: No acute intracranial abnormalities.   Electronically Signed   By: Chrissie Noa  Stevens M.D.   On: 06/05/2015 04:40        Management plans discussed with the patient, fAndria Meuseamily and they are in agreement.  CODE STATUS:     Code Status Orders        Start     Ordered   06/05/15 0829  Full code   Continuous     06/05/15 0828      TOTAL TIME TAKING CARE OF THIS PATIENT: 37 minutes.    Shaune Pollackhen, Josephine Wooldridge M.D on 06/06/2015 at 1:17 PM  Between 7am to 6pm - Pager - [phone]  After 6pm go to [email or web address] - password [email or web address] [facility]  [facility]  Office  [phone]  CC: Primary care physician; Marin CommentWELLS, CHERYL, FNP

## 2015-07-07 DIAGNOSIS — F419 Anxiety disorder, unspecified: Secondary | ICD-10-CM | POA: Insufficient documentation

## 2015-07-07 DIAGNOSIS — E291 Testicular hypofunction: Secondary | ICD-10-CM | POA: Insufficient documentation

## 2016-04-26 IMAGING — CT CT HEAD W/O CM
1 series · 16 of 30 positions shown, 20 images · non-contrast
Comparison: 02/01/2013

CLINICAL DATA: Hypertension, headache, blurred vision, and slurred
speech since 2 a.m..

EXAM:
CT HEAD WITHOUT CONTRAST
TECHNIQUE: Contiguous axial images were obtained from the base of the skull
through the vertex without intravenous contrast.

[Series 2: head wo · axial · 0.45mm/px · z∈[-121,+23]mm · 16 of 36 slices shown, 20 images]
[im 2/36  brain]
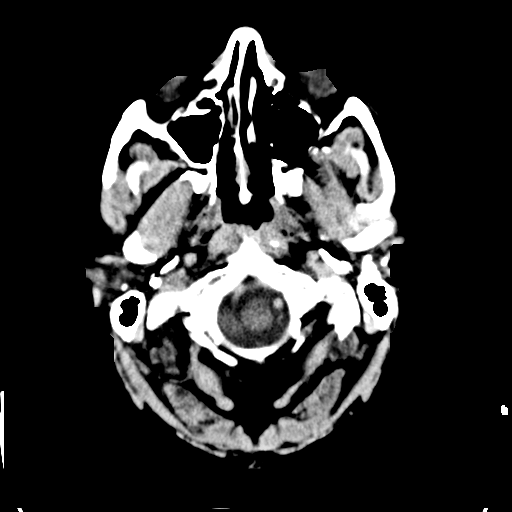
[im 2/36  bone]
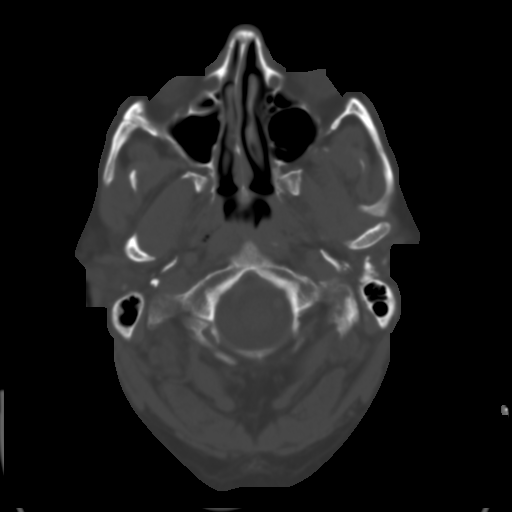
[im 4/36  brain]
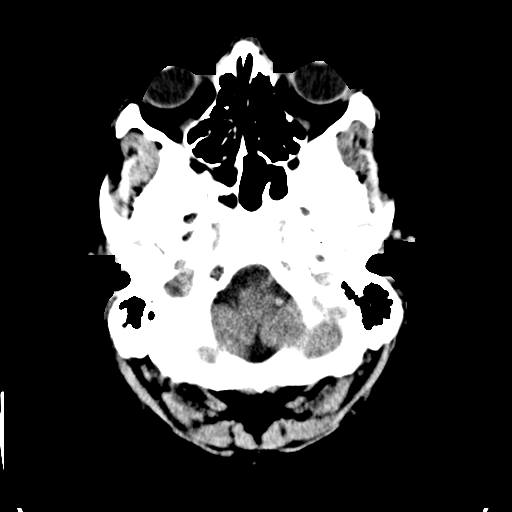
[im 7/36  brain]
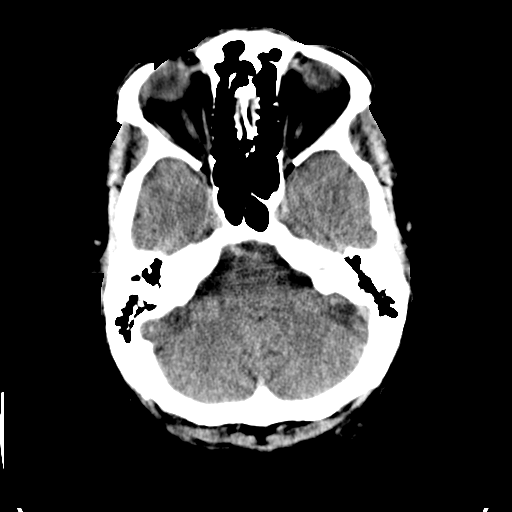
[im 9/36  brain]
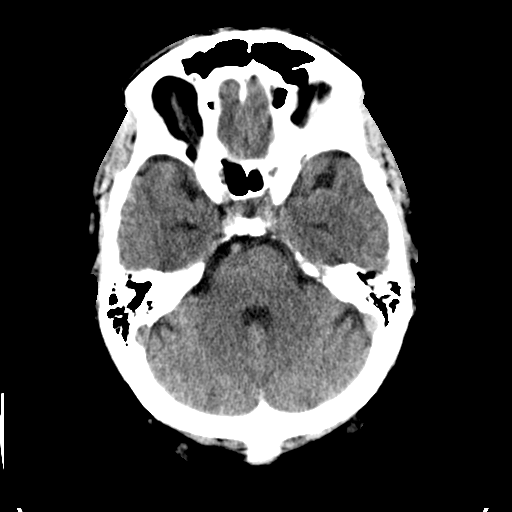
[im 10/36  brain]
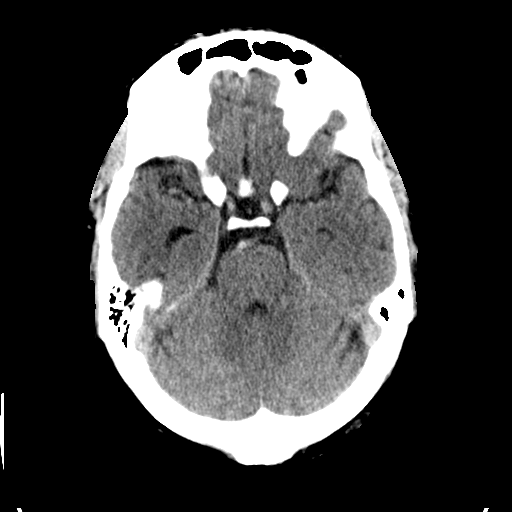
[im 10/36  bone]
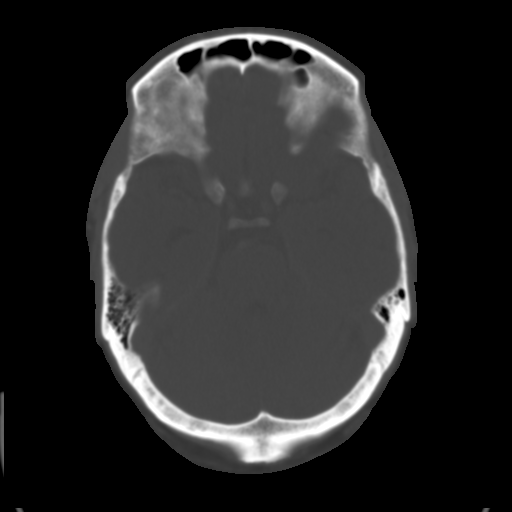
[im 13/36  brain]
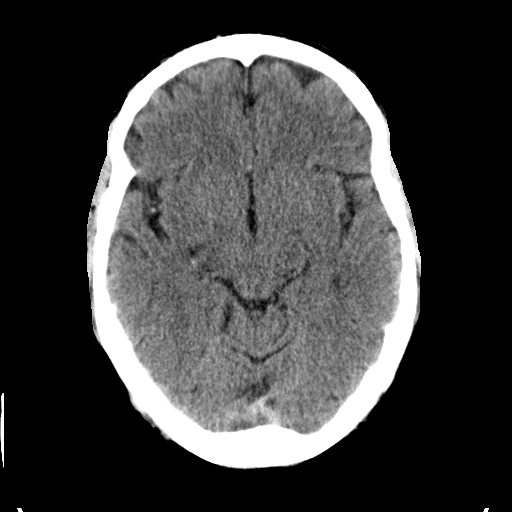
[im 15/36  brain]
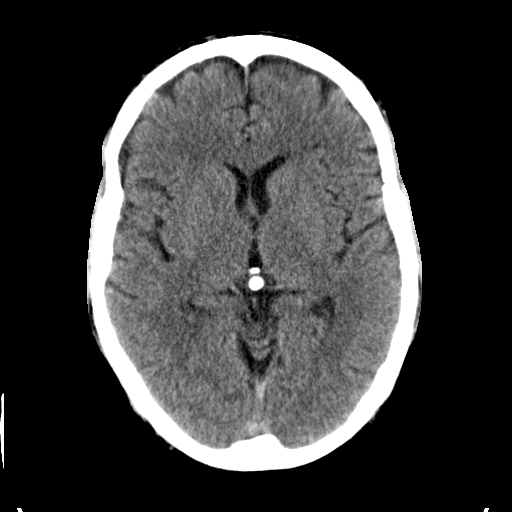
[im 17/36  brain]
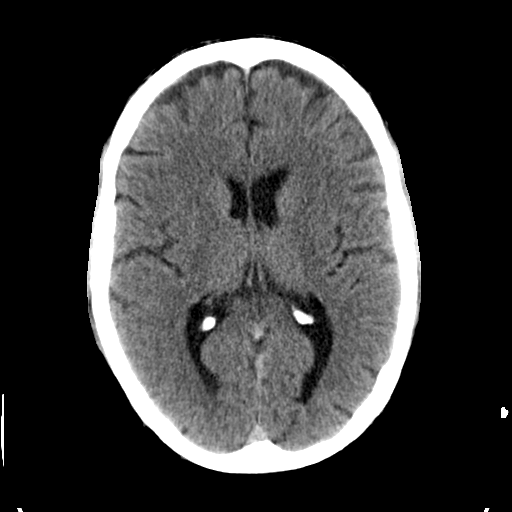
[im 19/36  brain]
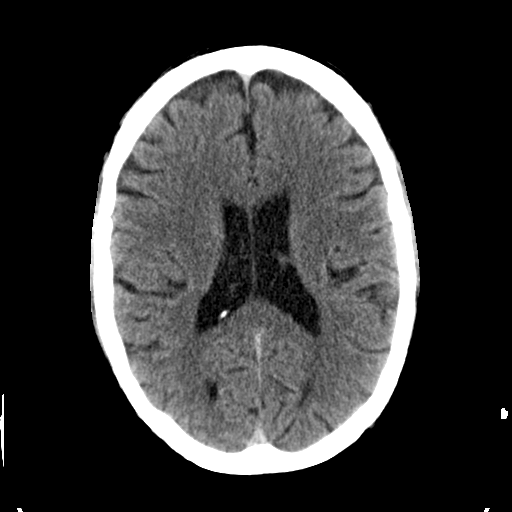
[im 19/36  bone]
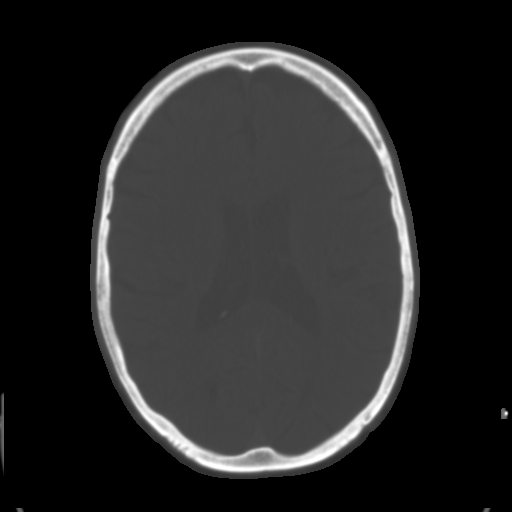
[im 21/36  brain]
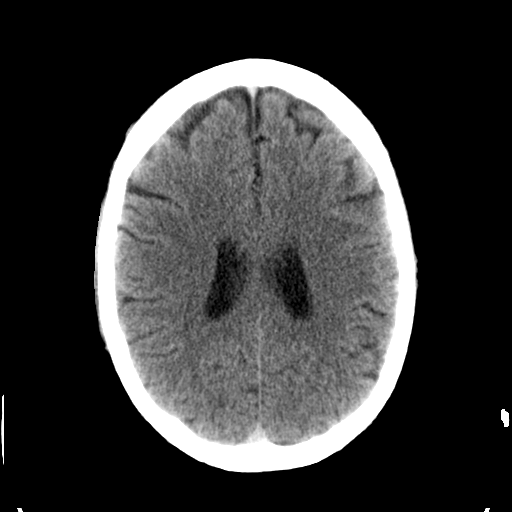
[im 23/36  brain]
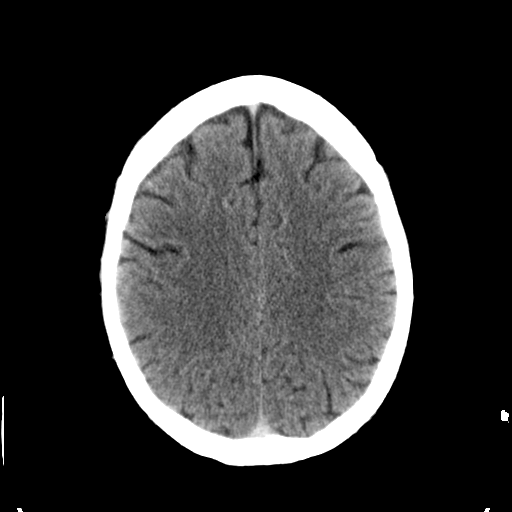
[im 26/36  brain]
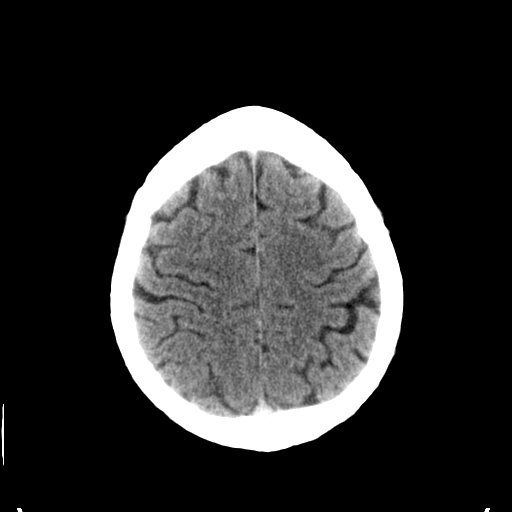
[im 27/36  brain]
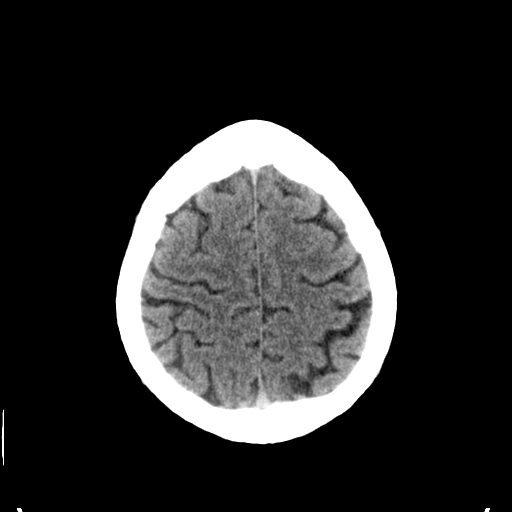
[im 27/36  bone]
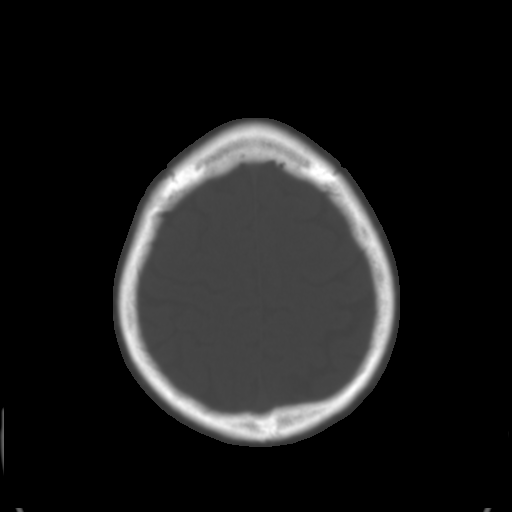
[im 29/36  brain]
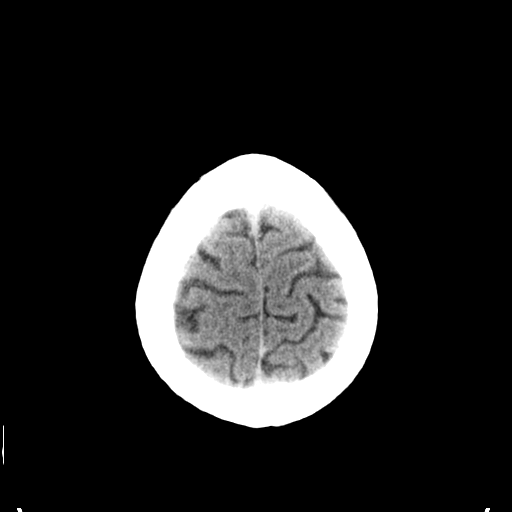
[im 32/36  brain]
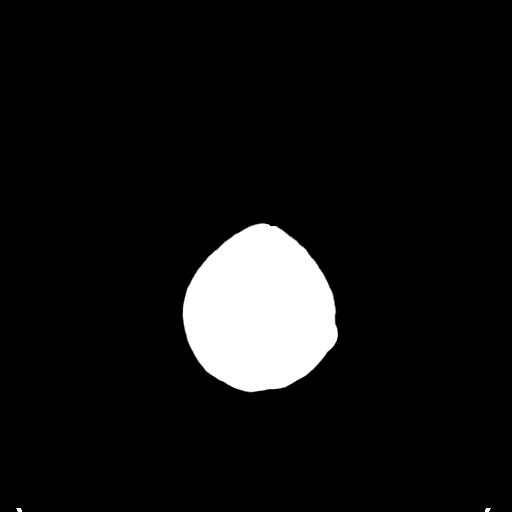
[im 34/36  brain]
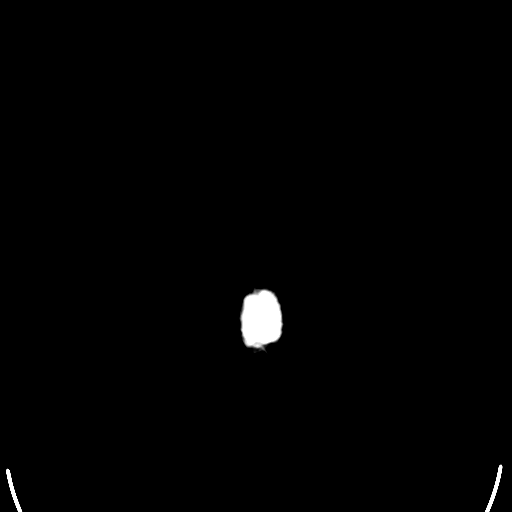

[16 of 30 positions shown; findings below may reference images not displayed]

FINDINGS: Ventricles and sulci are symmetrical. No mass effect or midline
shift. No abnormal extra-axial fluid collections. Gray-white matter
junctions are distinct. Basal cisterns are not effaced. No evidence
of acute intracranial hemorrhage. No depressed skull fractures.
Visualized paranasal sinuses and mastoid air cells are not
opacified.
IMPRESSION: No acute intracranial abnormalities.

## 2017-04-29 ENCOUNTER — Encounter: Payer: Self-pay | Admitting: Emergency Medicine

## 2017-04-29 ENCOUNTER — Emergency Department
Admission: EM | Admit: 2017-04-29 | Discharge: 2017-04-29 | Disposition: A | Discharge status: Home / Self Care | Payer: BLUE CROSS/BLUE SHIELD | Attending: Emergency Medicine | Admitting: Emergency Medicine

## 2017-04-29 DIAGNOSIS — R55 Syncope and collapse: Secondary | ICD-10-CM

## 2017-04-29 DIAGNOSIS — F329 Major depressive disorder, single episode, unspecified: Secondary | ICD-10-CM | POA: Diagnosis not present

## 2017-04-29 DIAGNOSIS — I10 Essential (primary) hypertension: Secondary | ICD-10-CM

## 2017-04-29 DIAGNOSIS — F419 Anxiety disorder, unspecified: Secondary | ICD-10-CM | POA: Insufficient documentation

## 2017-04-29 DIAGNOSIS — F1010 Alcohol abuse, uncomplicated: Secondary | ICD-10-CM | POA: Insufficient documentation

## 2017-04-29 DIAGNOSIS — F17210 Nicotine dependence, cigarettes, uncomplicated: Secondary | ICD-10-CM | POA: Insufficient documentation

## 2017-04-29 HISTORY — DX: Gout, unspecified: M10.9

## 2017-04-29 HISTORY — DX: Depression, unspecified: F32.A

## 2017-04-29 HISTORY — DX: Major depressive disorder, single episode, unspecified: F32.9

## 2017-04-29 HISTORY — DX: Alcohol abuse, uncomplicated: F10.10

## 2017-04-29 LAB — BASIC METABOLIC PANEL
Anion gap: 14 (ref 5–15)
BUN: 20 mg/dL (ref 6–20)
CO2: 24 mmol/L (ref 22–32)
Calcium: 9.3 mg/dL (ref 8.9–10.3)
Chloride: 104 mmol/L (ref 101–111)
Creatinine, Ser: 1.04 mg/dL (ref 0.61–1.24)
GFR calc Af Amer: >60 mL/min (ref >60)
GLUCOSE: 140 mg/dL — AB (ref 65–99)
POTASSIUM: 3.4 mmol/L — AB (ref 3.5–5.1)
SODIUM: 142 mmol/L (ref 135–145)

## 2017-04-29 LAB — URINE DRUG SCREEN, QUALITATIVE (ARMC ONLY)
Amphetamines, Ur Screen: NOT DETECTED
Barbiturates, Ur Screen: NOT DETECTED
Benzodiazepine, Ur Scrn: NOT DETECTED
COCAINE METABOLITE, UR ~~LOC~~: NOT DETECTED
Cannabinoid 50 Ng, Ur ~~LOC~~: NOT DETECTED
MDMA (ECSTASY) UR SCREEN: NOT DETECTED
Methadone Scn, Ur: NOT DETECTED
Opiate, Ur Screen: NOT DETECTED
Phencyclidine (PCP) Ur S: NOT DETECTED
TRICYCLIC, UR SCREEN: NOT DETECTED

## 2017-04-29 LAB — CBC
HEMATOCRIT: 53.8 % — AB (ref 40.0–52.0)
Hemoglobin: 18.8 g/dL — ABNORMAL HIGH (ref 13.0–18.0)
MCH: 33.7 pg (ref 26.0–34.0)
MCHC: 34.9 g/dL (ref 32.0–36.0)
MCV: 96.5 fL (ref 80.0–100.0)
PLATELETS: 225 10*3/uL (ref 150–440)
RBC: 5.57 MIL/uL (ref 4.40–5.90)
RDW: 12.5 % (ref 11.5–14.5)
WBC: 8.9 10*3/uL (ref 3.8–10.6)

## 2017-04-29 LAB — HEPATIC FUNCTION PANEL
ALK PHOS: 62 U/L (ref 38–126)
ALT: 34 U/L (ref 17–63)
AST: 33 U/L (ref 15–41)
Albumin: 4.8 g/dL (ref 3.5–5.0)
Bilirubin, Direct: 0.4 mg/dL (ref 0.1–0.5)
Indirect Bilirubin: 2.6 mg/dL — ABNORMAL HIGH (ref 0.3–0.9)
TOTAL PROTEIN: 8.1 g/dL (ref 6.5–8.1)
Total Bilirubin: 3 mg/dL — ABNORMAL HIGH (ref 0.3–1.2)

## 2017-04-29 LAB — URINALYSIS, COMPLETE (UACMP) WITH MICROSCOPIC
BACTERIA UA: NONE SEEN
Bilirubin Urine: NEGATIVE
Glucose, UA: NEGATIVE mg/dL
Hgb urine dipstick: NEGATIVE
Ketones, ur: 20 mg/dL — AB
LEUKOCYTES UA: NEGATIVE
NITRITE: NEGATIVE
PROTEIN: NEGATIVE mg/dL
RBC / HPF: NONE SEEN RBC/hpf (ref 0–5)
SPECIFIC GRAVITY, URINE: 1.017 (ref 1.005–1.030)
SQUAMOUS EPITHELIAL / LPF: NONE SEEN
pH: 6 (ref 5.0–8.0)

## 2017-04-29 LAB — TROPONIN I

## 2017-04-29 LAB — ETHANOL

## 2017-04-29 MED ORDER — ACETAMINOPHEN 500 MG PO TABS
1000.0000 mg | ORAL_TABLET | Freq: Once | ORAL | Status: AC
  Administered 2017-04-29: 1000 mg via ORAL

## 2017-04-29 MED ORDER — LISINOPRIL 10 MG PO TABS
20.0000 mg | ORAL_TABLET | Freq: Once | ORAL | Status: AC
  Administered 2017-04-29: 20 mg via ORAL
  Filled 2017-04-29: qty 2

## 2017-04-29 MED ORDER — ACETAMINOPHEN 500 MG PO TABS
ORAL_TABLET | ORAL | Status: AC
  Administered 2017-04-29: 1000 mg via ORAL
  Filled 2017-04-29: qty 2

## 2017-04-29 MED ORDER — AMLODIPINE BESYLATE 5 MG PO TABS
5.0000 mg | ORAL_TABLET | Freq: Once | ORAL | Status: AC
  Administered 2017-04-29: 5 mg via ORAL
  Filled 2017-04-29: qty 1

## 2017-04-29 MED ORDER — LISINOPRIL-HYDROCHLOROTHIAZIDE 20-12.5 MG PO TABS: 1.0000 | ORAL_TABLET | Freq: Every day | ORAL | 1 refills | Status: DC

## 2017-04-29 MED ORDER — ONDANSETRON HCL 4 MG/2ML IJ SOLN
4.0000 mg | Freq: Once | INTRAMUSCULAR | Status: AC
  Administered 2017-04-29: 4 mg via INTRAVENOUS
  Filled 2017-04-29: qty 2

## 2017-04-29 MED ORDER — SODIUM CHLORIDE 0.9 % IV BOLUS (SEPSIS)
1000.0000 mL | Freq: Once | INTRAVENOUS | Status: AC
  Administered 2017-04-29: 1000 mL via INTRAVENOUS

## 2017-04-29 MED ORDER — LABETALOL HCL 5 MG/ML IV SOLN
10.0000 mg | Freq: Once | INTRAVENOUS | Status: AC
  Administered 2017-04-29: 10 mg via INTRAVENOUS
  Filled 2017-04-29: qty 4

## 2017-04-29 MED ORDER — AMLODIPINE BESYLATE 10 MG PO TABS: 10.0000 mg | ORAL_TABLET | Freq: Every day | ORAL | 1 refills | Status: DC

## 2017-04-29 NOTE — ED Provider Notes (Signed)
[facility] Emergency Department Provider Note  ____________________________________________  Time seen: Approximately 1:35 PM  I have reviewed the triage vital signs and the nursing notes.   HISTORY  Chief Complaint Loss of Consciousness   HPI Ballard Budney is a 56 y.o. male with a history of alcohol abuse and hypertension who presents for evaluationof near syncopal episode. Patient reports that he drinks heavily for most of his life. Has not stopped drinking for more than 24 hours in over a decade. He says that he drinks 5-6 liquor shots every week day after work and on the weekends he drinks one large bottle of bourbon or vodka. According to his fiance he went on a binge this weekend and since Friday has not had anything to eat and has been drinking a large bottle of bourbon plus half a bottle of vodka a day. His last drink was yesterday at 8:30 PM. He was driving to work this morning he started feeling dizzy like he was going to pass out. When he got to work he started vomiting which prompted him to call his fiance. She took him to urgent care and his blood pressure was systolics of 211 so patient was sent here for further evaluation. Patient is supposed to be on amlodipine and lisinopril however has not taken a long time. Patient does not wish detox at this time but would like to get the information for RTA/RTS for outpatient help and cutting his alcohol consumption. He denies any other drug use. No chest pain, no palpitations, no headache, no shortness of breath, no abdominal pain.  Past Medical History:  Diagnosis Date  . Alcohol abuse   . Anxiety   . Depression   . Gout   . Hypertension     Patient Active Problem List   Diagnosis Date Noted  . Alcohol withdrawal (HCC) 06/05/2015    Past Surgical History:  Procedure Laterality Date  . HERNIA REPAIR      Prior to Admission medications   Medication Sig Start Date End Date Taking? Authorizing  Provider  ALPRAZolam (XANAX) 0.25 MG tablet Take 1 tablet (0.25 mg total) by mouth 3 (three) times daily as needed for anxiety. 06/06/15   Shaune Pollack, MD  amLODipine (NORVASC) 10 MG tablet Take 1 tablet (10 mg total) by mouth daily. 04/29/17   Nita Sickle, MD  cloNIDine (CATAPRES) 0.1 MG tablet Take 0.1 mg by mouth 2 (two) times daily.    [provider]  lisinopril-hydrochlorothiazide (PRINZIDE,ZESTORETIC) 20-12.5 MG tablet Take 1 tablet by mouth daily. 04/29/17   Nita Sickle, MD  testosterone cypionate (DEPOTESTOSTERONE CYPIONATE) 200 MG/ML injection Inject 100 mg into the muscle every 14 (fourteen) days.    [provider]    Allergies Patient has no known allergies.  No family history on file.  Social History Social History  Substance Use Topics  . Smoking status: Current Some Day Smoker    Types: Cigarettes  . Smokeless tobacco: Former Neurosurgeon  . Alcohol use Yes     Comment: daily, consumes 2 half-gallons of liquor a week    Review of Systems  Constitutional: Negative for fever. + near syncope Eyes: Negative for visual changes. ENT: Negative for sore throat. Neck: No neck pain  Cardiovascular: Negative for chest pain. Respiratory: Negative for shortness of breath. Gastrointestinal: Negative for abdominal pain, vomiting or diarrhea. Genitourinary: Negative for dysuria. Musculoskeletal: Negative for back pain. Skin: Negative for rash. Neurological: Negative for headaches, weakness or numbness. Psych: No SI or  HI  ____________________________________________   PHYSICAL EXAM:  VITAL SIGNS: ED Triage Vitals  Enc Vitals Group     BP 04/29/17 1050 (!) 219/128     Pulse Rate 04/29/17 1044 92     Resp 04/29/17 1044 16     Temp 04/29/17 1044 98.3 F (36.8 C)     Temp Source 04/29/17 1044 Oral     SpO2 04/29/17 1044 96 %     Weight 04/29/17 1045 240 lb (108.9 kg)     Height 04/29/17 1045 6' (1.829 m)     Head Circumference --      Peak Flow  --      Pain Score 04/29/17 1044 5     Pain Loc --      Pain Edu? --      Excl. in GC? --     Constitutional: Alert and oriented. Well appearing and in no apparent distress. HEENT:      Head: Normocephalic and atraumatic.         Eyes: Conjunctivae are normal. Sclera is non-icteric.       Mouth/Throat: Mucous membranes are moist.       Neck: Supple with no signs of meningismus. Cardiovascular: Regular rate and rhythm. No murmurs, gallops, or rubs. 2+ symmetrical distal pulses are present in all extremities. No JVD. Respiratory: Normal respiratory effort. Lungs are clear to auscultation bilaterally. No wheezes, crackles, or rhonchi.  Gastrointestinal: Soft, non tender, and non distended with positive bowel sounds. No rebound or guarding. Musculoskeletal: Nontender with normal range of motion in all extremities. No edema, cyanosis, or erythema of extremities. Neurologic: Normal speech and language. A & O x3, PERRL, no nystagmus, CN II-XII intact, motor testing reveals good tone and bulk throughout. There is no evidence of pronator drift or dysmetria. Muscle strength is 5/5 throughout. Deep tendon reflexes are 2+ throughout with downgoing toes. Sensory examination is intact. Gait is normal. Skin: Skin is warm, dry and intact. No rash noted. Psychiatric: Mood and affect are normal. Speech and behavior are normal.  ____________________________________________   LABS (all labs ordered are listed, but only abnormal results are displayed)  Labs Reviewed  BASIC METABOLIC PANEL - Abnormal; Notable for the following:       Result Value   Potassium 3.4 (*)    Glucose, Bld 140 (*)    All other components within normal limits  CBC - Abnormal; Notable for the following:    Hemoglobin 18.8 (*)    HCT 53.8 (*)    All other components within normal limits  URINALYSIS, COMPLETE (UACMP) WITH MICROSCOPIC - Abnormal; Notable for the following:    Color, Urine YELLOW (*)    APPearance CLEAR (*)     Ketones, ur 20 (*)    All other components within normal limits  HEPATIC FUNCTION PANEL - Abnormal; Notable for the following:    Total Bilirubin 3.0 (*)    Indirect Bilirubin 2.6 (*)    All other components within normal limits  ETHANOL  URINE DRUG SCREEN, QUALITATIVE ([facility] ONLY)  TROPONIN I  CBG MONITORING, ED   ____________________________________________  EKG  ED ECG REPORT I, Nita Sicklearolina Mieczyslaw Stamas, the attending physician, personally viewed and interpreted this ECG.   Normal sinus rhythm, rate of 93, normal intervals, normal axis, no ST elevations or depressions. Unchanged when compared to prior from 2016 ____________________________________________  RADIOLOGY  none  ____________________________________________   PROCEDURES  Procedure(s) performed: None Procedures Critical Care performed:  None ____________________________________________   INITIAL IMPRESSION / ASSESSMENT  AND PLAN / ED COURSE  56 y.o. male with a history of alcohol abuse and hypertension who presents for evaluationof near syncopal episode in the setting of 3 days of binge alcohol consumption, not eating, and noncompliant with his antihypertensives. The patient is neurologically intact. His vital signs show BP of 219/128. He has no headache or chest pain. He does not wish help with detoxing at this time. CIWA score is 5. Alcohol level negative. Labs showing hemoconcentration but normal electrolytes and normal kidney function and normal AG. EKG with no evidence of ischemia or arrhythmia. Patient is neuro intact. Will re-start home meds, give IVF and provide info for outpatient resources.     _________________________ 4:36 PM on 04/29/2017 -----------------------------------------  BP trending dow. Workup essentially unremarkable. The patient feels back to his baseline. Patient provided with information about AA and RTS. Patient is also given prescription for his antihypertensives. Discussed signs and  symptoms of compensated withdrawal with him and his wife who recommended that he returns to the emergency room if those develop.  Pertinent labs & imaging results that were available during my care of the patient were reviewed by me and considered in my medical decision making (see chart for details).    ____________________________________________   FINAL CLINICAL IMPRESSION(S) / ED DIAGNOSES  Final diagnoses:  Near syncope  Alcohol abuse  Hypertension, unspecified type      NEW MEDICATIONS STARTED DURING THIS VISIT:  Current Discharge Medication List       Note:  This document was prepared using Dragon voice recognition software and may include unintentional dictation errors.    Don Perking, [place], MD 04/29/17 [phone]

## 2017-04-29 NOTE — ED Notes (Signed)
Report from Kate, RN. Care assumed by this RN.  

## 2017-04-29 NOTE — ED Notes (Signed)
Discussed with EDP pt BP. Orders placed by EDP.

## 2017-04-29 NOTE — ED Notes (Signed)
Pt ambulatory to toilet to attempt urine collection. Did NOT want this RN to stay in room. Significant other at bedside with pt.

## 2017-04-29 NOTE — ED Notes (Addendum)
PT states has been off BP medications for "months" pt also c/o of bilat foot/ankle pain. States it will be in R toe and then go to L ankle. Hx gout. Pt states he has pain with ambulation. States swelling in feet/ankles. Hx ETOH. Last drink at 8pm yesterday.

## 2017-04-29 NOTE — ED Notes (Signed)
ED Provider at bedside. 

## 2017-04-29 NOTE — ED Triage Notes (Signed)
Drinking heavily recently and not eating or drinking fluids.  Pt went to work today and had syncopal episode.  Does not remember driving.

## 2017-04-29 NOTE — Discharge Instructions (Signed)
As we have discussed, please do NOT drink alcohol in excess as alcohol intoxication can be life threatening. NEVER drive or operate machinery after drinking alcohol.    Do NOT drive today.  Drink plenty of water or other clear liquids (Gatorade, Powerade, etc) for the next 24 hours to help your body recover and stay hydrated.  Follow-up care is a key part of your treatment and safety. Follow up with your doctor in the next 2-5 days. If you need help to stop drinking please call RTS at [phone]  How can you care for yourself at home?  Be safe with medicines. Take your medicines exactly as prescribed. Call your doctor if you think you are having a problem with your medicine.  Your doctor may have prescribed disulfiram (Antabuse). Do not drink any alcohol while you are taking this medicine. You may have severe or even life-threatening side effects from even small amounts of alcohol.  If you were given medicine to prevent nausea, be sure to take it exactly as prescribed.  Before you take any medicine, tell your doctor if:  You have had a bad reaction to any medicines in the past.  You are taking other medicines, including over-the-counter ones, or have other health problems.  You are or could be pregnant. Be prepared to have some symptoms of withdrawal in the next few days which includes agitation, tremors, anxiety, sweating, headache, nausea, vomiting. Drink plenty of liquids in the next few days to prevent dehydration. Seek help if you need it to stop drinking. Getting counseling and joining a support group can help you stay sober. Try a support group such as Alcoholics Anonymous.  Avoid alcohol when you take medicines. It can react with many medicines and cause serious problems.  When should you call for help?  Call 911 anytime you think you may need emergency care. For example, call if:  You feel confused and are seeing things that are not there.  You are thinking about killing yourself  or hurting others.  You have a seizure.  You vomit blood or what looks like coffee grounds.  Call your doctor now or seek immediate medical care if:  You have trembling, restlessness, sweating, and other withdrawal symptoms that are new or that get worse.  Your withdrawal symptoms come back after not bothering you for days or weeks.  You can't stop vomiting.  Watch closely for changes in your health, and be sure to contact your doctor if:  You need help to stop drinking.

## 2017-04-29 NOTE — ED Notes (Signed)
Pt alert and oriented X4, active, cooperative, pt in NAD. RR even and unlabored, color WNL.  Pt informed to return if any life threatening symptoms occur.   

## 2017-04-29 NOTE — ED Notes (Signed)
Dr. Veronese at bedside 

## 2017-05-23 DIAGNOSIS — N179 Acute kidney failure, unspecified: Secondary | ICD-10-CM | POA: Insufficient documentation

## 2017-05-23 DIAGNOSIS — M10062 Idiopathic gout, left knee: Secondary | ICD-10-CM | POA: Insufficient documentation

## 2017-05-23 DIAGNOSIS — F17200 Nicotine dependence, unspecified, uncomplicated: Secondary | ICD-10-CM | POA: Insufficient documentation

## 2017-05-23 DIAGNOSIS — G9340 Encephalopathy, unspecified: Secondary | ICD-10-CM | POA: Insufficient documentation

## 2017-06-17 ENCOUNTER — Emergency Department
Admission: EM | Admit: 2017-06-17 | Discharge: 2017-06-17 | Disposition: A | Discharge status: Home / Self Care | Payer: BLUE CROSS/BLUE SHIELD | Attending: Emergency Medicine | Admitting: Emergency Medicine

## 2017-06-17 ENCOUNTER — Encounter: Payer: Self-pay | Admitting: Emergency Medicine

## 2017-06-17 ENCOUNTER — Emergency Department: Payer: BLUE CROSS/BLUE SHIELD

## 2017-06-17 DIAGNOSIS — I10 Essential (primary) hypertension: Secondary | ICD-10-CM | POA: Insufficient documentation

## 2017-06-17 DIAGNOSIS — I809 Phlebitis and thrombophlebitis of unspecified site: Secondary | ICD-10-CM

## 2017-06-17 DIAGNOSIS — Z79899 Other long term (current) drug therapy: Secondary | ICD-10-CM | POA: Diagnosis not present

## 2017-06-17 DIAGNOSIS — M79602 Pain in left arm: Secondary | ICD-10-CM | POA: Diagnosis present

## 2017-06-17 DIAGNOSIS — F17210 Nicotine dependence, cigarettes, uncomplicated: Secondary | ICD-10-CM | POA: Diagnosis not present

## 2017-06-17 DIAGNOSIS — I808 Phlebitis and thrombophlebitis of other sites: Secondary | ICD-10-CM | POA: Insufficient documentation

## 2017-06-17 NOTE — ED Triage Notes (Signed)
Pt reports hospitalized 05/22/17 for gout and had several IV's in his left arm. Pt reports since then has had swelling and tenderness to left arm. Strong radial pulses palpated in right arm, brisk capillary refill noted to right fingers. Small lump palpated on left arm. Slight redness noted to lump on left arm.

## 2017-06-17 NOTE — ED Provider Notes (Signed)
[name]       Time seen: ----------------------------------------- 6:26 PM on 06/17/2017 -----------------------------------------     I have reviewed the triage vital signs and the nursing notes.   HISTORY   Chief Complaint Possible phebitis    HPI Kenneth Trevino is a 56 y.o. male who presents to the ED for left arm pain. Patient feels a possible blood clot in his left arm. Patient recently had several IVs in his left arm. Since then he said swelling and tenderness and a palpable cord like lesion in the left forearm dorsally and near his left before meals. He has had some redness, otherwise denies complaints.   Past Medical History:  Diagnosis Date  . Alcohol abuse   . Anxiety   . Depression   . Gout   . Hypertension     Patient Active Problem List   Diagnosis Date Noted  . Alcohol withdrawal (HCC) 06/05/2015    Past Surgical History:  Procedure Laterality Date  . HERNIA REPAIR      Allergies Patient has no known allergies.  Social History Social History  Substance Use Topics  . Smoking status: Current Some Day Smoker    Types: Cigarettes  . Smokeless tobacco: Former NeurosurgeonUser  . Alcohol use Yes     Comment: daily, consumes 2 half-gallons of liquor a week    Review of Systems Constitutional: Negative for fever. Cardiovascular: Negative for chest pain. Respiratory: Negative for shortness of breath. Gastrointestinal: Negative for abdominal pain, vomiting and diarrhea. Genitourinary: Negative for dysuria. Musculoskeletal: Positive for left arm pain Skin: Positive for left arm erythema Neurological: Negative for headaches, focal weakness or numbness.  All systems negative/normal/unremarkable except as stated in the HPI  ____________________________________________   PHYSICAL EXAM:  VITAL SIGNS: ED Triage Vitals  Enc Vitals Group     BP 06/17/17 1626 (!) 179/113     Pulse Rate 06/17/17  1626 78     Resp 06/17/17 1626 18     Temp 06/17/17 1626 98.1 F (36.7 C)     Temp Source 06/17/17 1626 Oral     SpO2 06/17/17 1626 98 %     Weight 06/17/17 1626 240 lb (108.9 kg)     Height 06/17/17 1626 6' (1.829 m)     Head Circumference --      Peak Flow --      Pain Score 06/17/17 1625 1     Pain Loc --      Pain Edu? --      Excl. in GC? --     Constitutional: Alert and oriented. Well appearing and in no distress. Eyes: Conjunctivae are normal. Normal extraocular movements. Musculoskeletal: Palpable cord is present over the left proximal and midforearm medially and dorsally Neurologic:  Normal speech and language. No gross focal neurologic deficits are appreciated.  Skin:  Skin is warm, dry and intact. Mild erythema is appreciated in the Left forearm Psychiatric: Mood and affect are normal. Speech and behavior are normal.   ____________________________________________  ED COURSE:  Pertinent labs & imaging results that were available during my care of the patient were reviewed by me and considered in my medical decision making (see chart for details). Patient presents for likely superficial phlebitis, we will assess with imaging as indicated.   Procedures RADIOLOGY Images were viewed by me  Left upper extremity ultrasound IMPRESSION: Superficial thrombophlebitis of the left cephalic and basilic veins as well as a branch vein of the basilic vein in  the left forearm. The deep venous system is normally patent. ____________________________________________  FINAL ASSESSMENT AND PLAN  Superficial phlebitis  Plan: Patient's imaging were dictated above. Patient had presented for Symptoms of phlebitis that was confirmed with ultrasound. He'll be encouraged to use warm compresses, anti-inflammatory medicine and follow up as needed.   Emily FilbertWilliams, Marisella Puccio E, MD   Note: This note was generated in part or whole with voice recognition software. Voice recognition is usually  quite accurate but there are transcription errors that can and very often do occur. I apologize for any typographical errors that were not detected and corrected.     Emily FilbertWilliams, Keenon Leitzel E, MD 06/17/17 [phone]

## 2018-06-30 DIAGNOSIS — F1010 Alcohol abuse, uncomplicated: Secondary | ICD-10-CM | POA: Diagnosis not present

## 2018-06-30 DIAGNOSIS — R079 Chest pain, unspecified: Secondary | ICD-10-CM | POA: Diagnosis not present

## 2018-06-30 DIAGNOSIS — Z8739 Personal history of other diseases of the musculoskeletal system and connective tissue: Secondary | ICD-10-CM | POA: Diagnosis not present

## 2018-06-30 DIAGNOSIS — R1013 Epigastric pain: Secondary | ICD-10-CM | POA: Diagnosis not present

## 2018-06-30 DIAGNOSIS — R51 Headache: Secondary | ICD-10-CM | POA: Diagnosis not present

## 2018-06-30 DIAGNOSIS — I10 Essential (primary) hypertension: Secondary | ICD-10-CM | POA: Diagnosis not present

## 2018-06-30 DIAGNOSIS — K759 Inflammatory liver disease, unspecified: Secondary | ICD-10-CM | POA: Diagnosis not present

## 2018-06-30 DIAGNOSIS — K760 Fatty (change of) liver, not elsewhere classified: Secondary | ICD-10-CM | POA: Diagnosis not present

## 2018-06-30 DIAGNOSIS — K5730 Diverticulosis of large intestine without perforation or abscess without bleeding: Secondary | ICD-10-CM | POA: Diagnosis not present

## 2018-07-07 DIAGNOSIS — L509 Urticaria, unspecified: Secondary | ICD-10-CM | POA: Diagnosis not present

## 2018-07-07 DIAGNOSIS — R21 Rash and other nonspecific skin eruption: Secondary | ICD-10-CM | POA: Diagnosis not present

## 2018-07-07 DIAGNOSIS — Z79899 Other long term (current) drug therapy: Secondary | ICD-10-CM | POA: Diagnosis not present

## 2018-07-07 DIAGNOSIS — L299 Pruritus, unspecified: Secondary | ICD-10-CM | POA: Diagnosis not present

## 2018-07-07 DIAGNOSIS — I10 Essential (primary) hypertension: Secondary | ICD-10-CM | POA: Diagnosis not present

## 2018-07-09 DIAGNOSIS — K759 Inflammatory liver disease, unspecified: Secondary | ICD-10-CM | POA: Diagnosis not present

## 2018-07-09 DIAGNOSIS — I10 Essential (primary) hypertension: Secondary | ICD-10-CM | POA: Diagnosis not present

## 2018-07-09 DIAGNOSIS — M1A0720 Idiopathic chronic gout, left ankle and foot, without tophus (tophi): Secondary | ICD-10-CM | POA: Diagnosis not present

## 2018-08-05 DIAGNOSIS — I10 Essential (primary) hypertension: Secondary | ICD-10-CM | POA: Diagnosis not present

## 2018-08-05 DIAGNOSIS — M1A0720 Idiopathic chronic gout, left ankle and foot, without tophus (tophi): Secondary | ICD-10-CM | POA: Diagnosis not present

## 2018-08-15 IMAGING — US US EXTREM  UP VENOUS*L*
1 series · 13 of 24 positions shown · non-contrast
Comparison: None.

CLINICAL DATA: Left upper extremity edema. Recent IV placements in
left arm during hospitalization.



[Series 1: us extrem up venous*left* · 0.12mm/px · 13 of 42 slices shown]
[im 1/42]
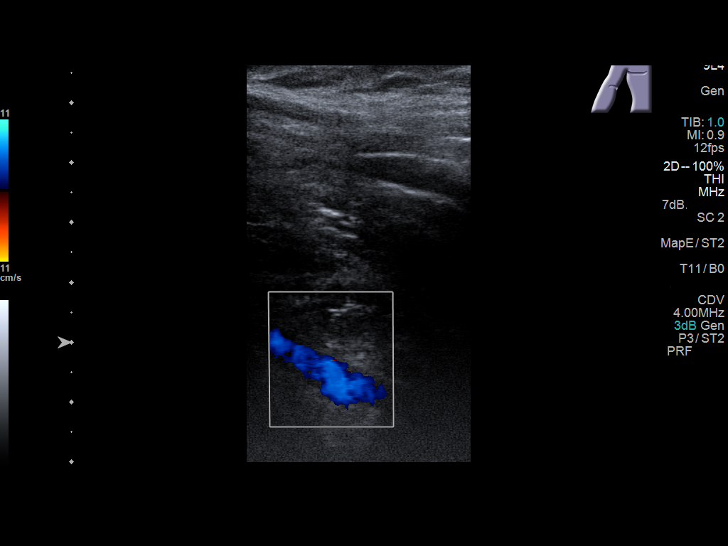
[im 4/42]
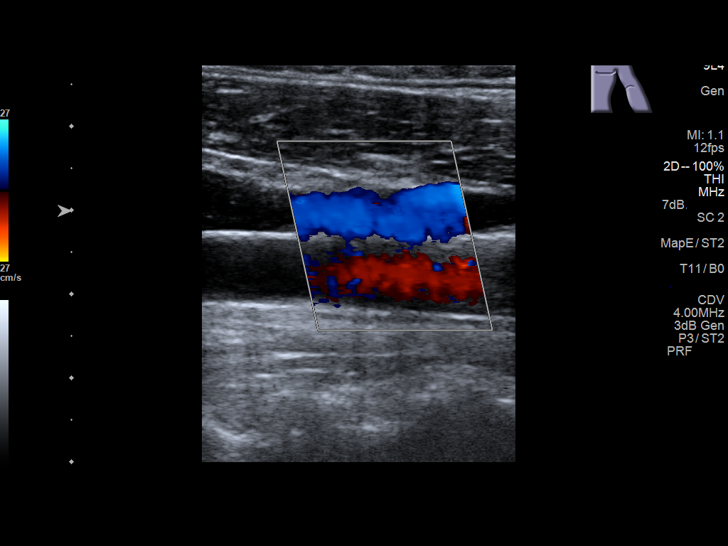
[im 8/42]
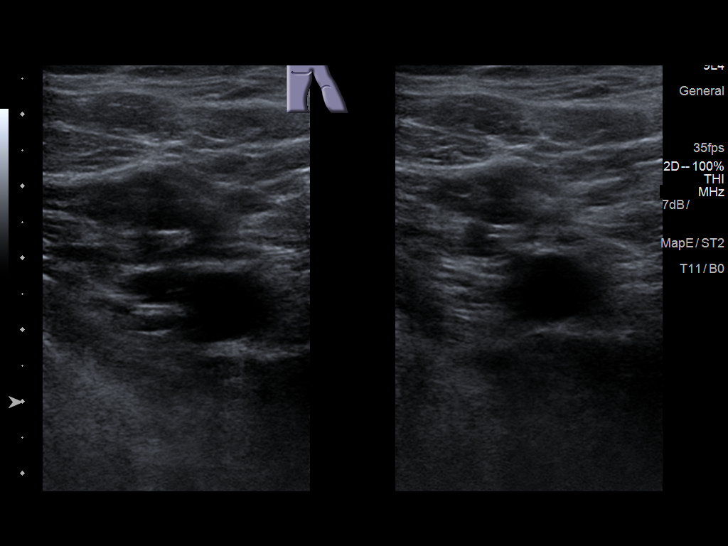
[im 11/42]
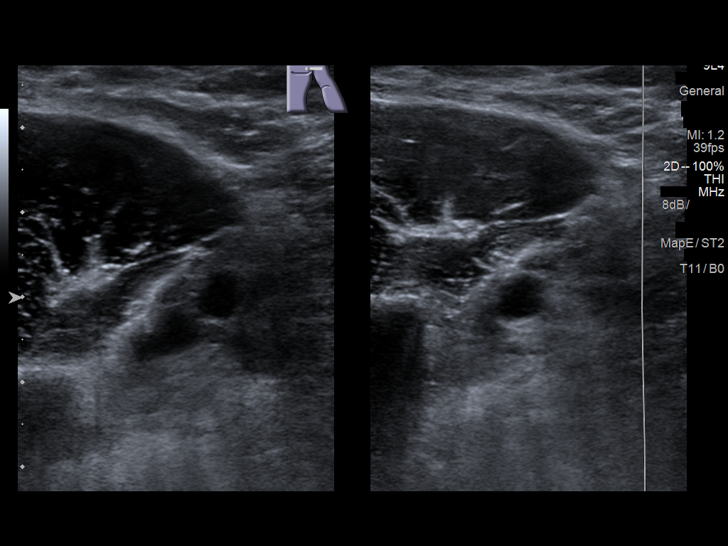
[im 15/42]
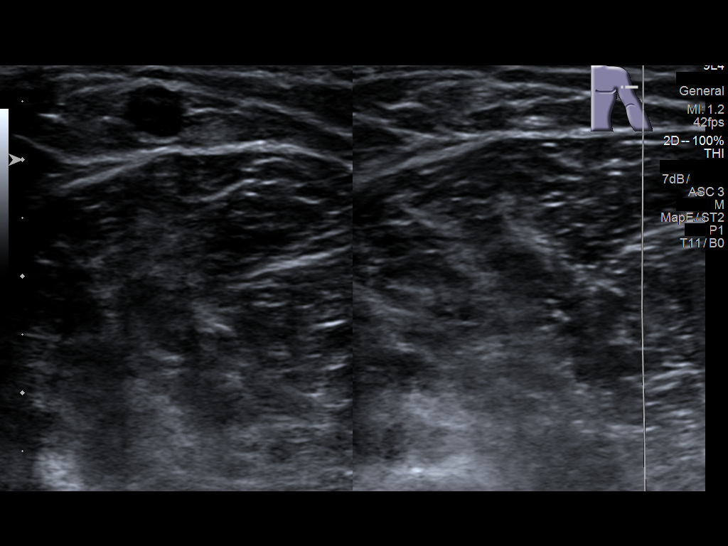
[im 18/42]
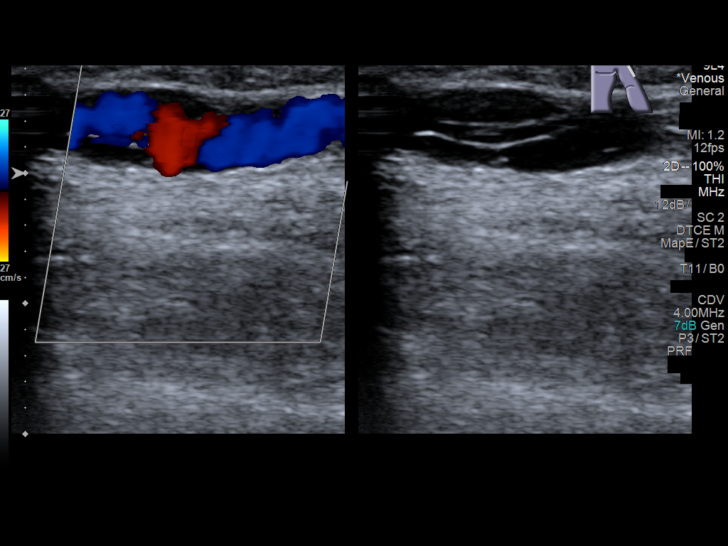
[im 22/42]
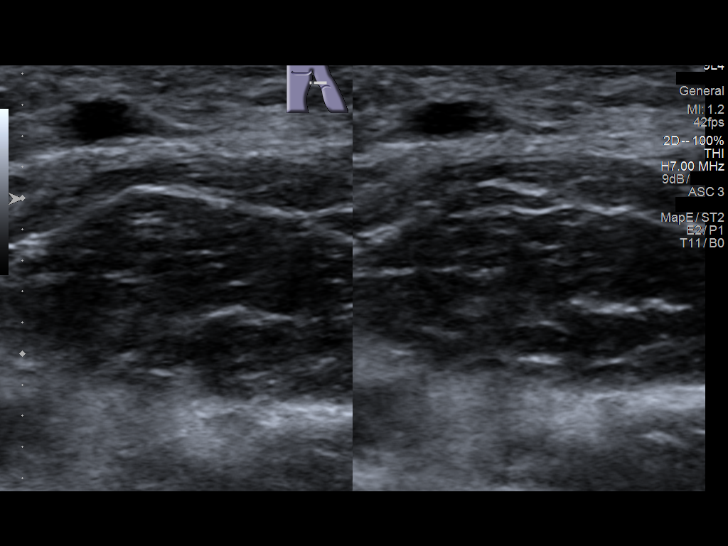
[im 24/42]
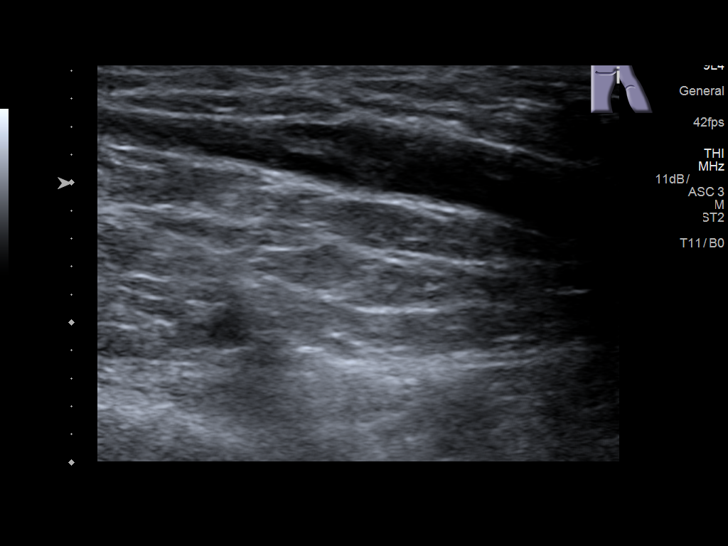
[im 27/42]
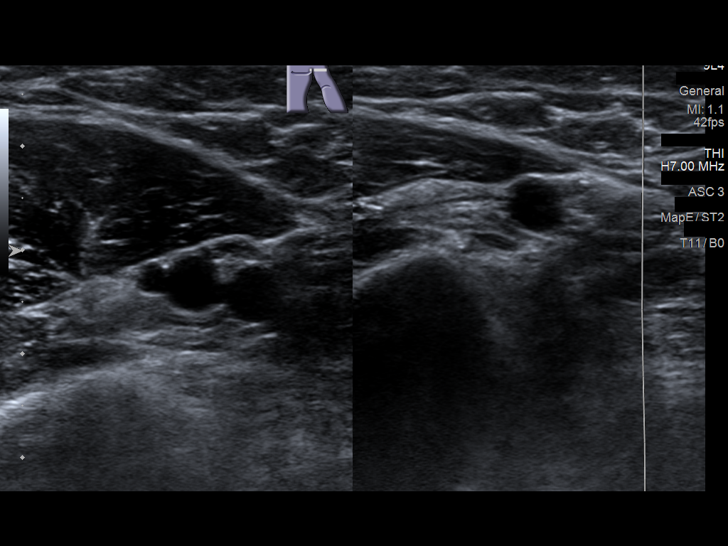
[im 31/42]
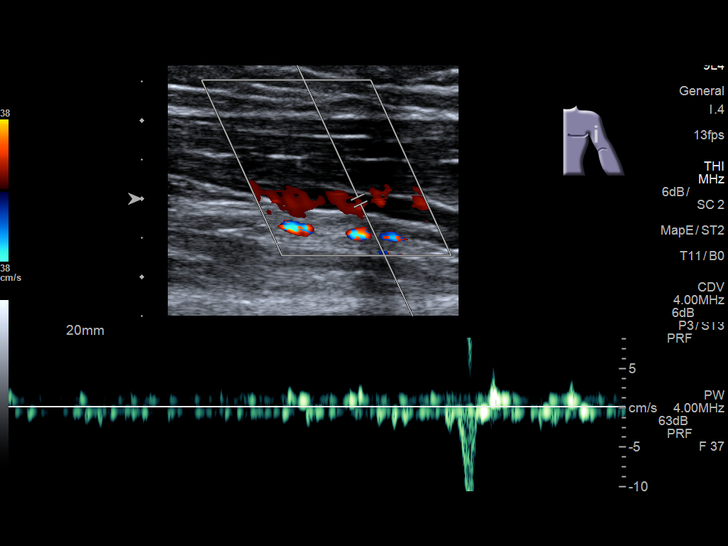
[im 34/42]
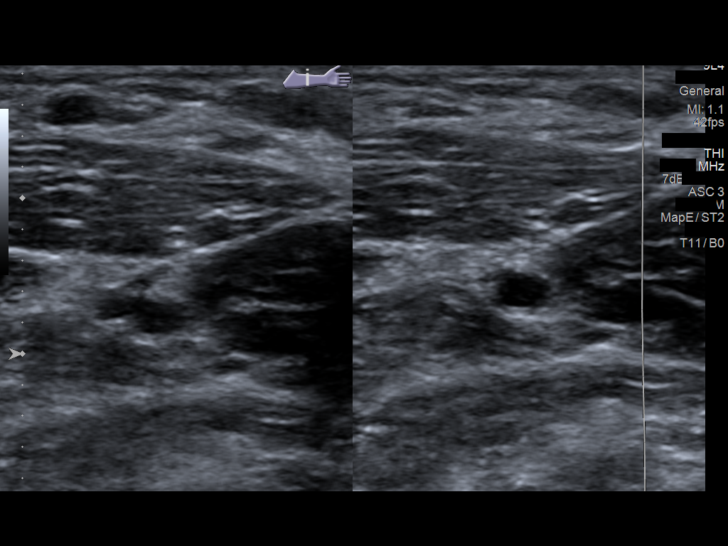
[im 38/42]
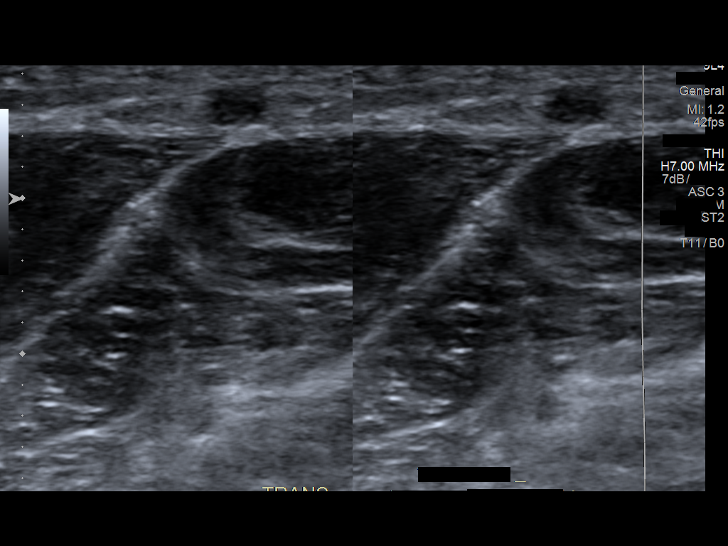
[im 42/42]
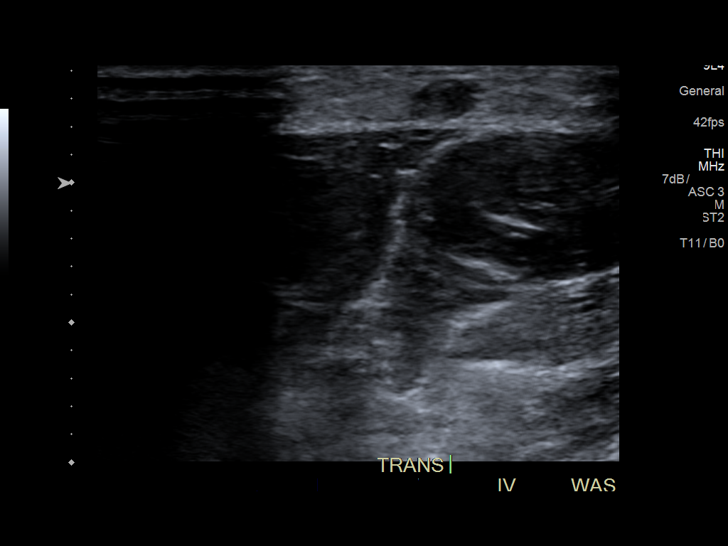

[13 of 24 positions shown; findings below may reference images not displayed]

FINDINGS: Contralateral Subclavian Vein: Respiratory phasicity is normal and
symmetric with the symptomatic side. No evidence of thrombus. Normal
compressibility.

Internal Jugular Vein: No evidence of thrombus. Normal
compressibility, respiratory phasicity and response to augmentation.

Subclavian Vein: No evidence of thrombus. Normal compressibility,
respiratory phasicity and response to augmentation.

Axillary Vein: No evidence of thrombus. Normal compressibility,
respiratory phasicity and response to augmentation.

Cephalic Vein: Nonocclusive thrombus is present in the left cephalic
vein just above the antecubital fossa.

Basilic Vein: Nonocclusive thrombus is present in the basilic vein
of the medial forearm and extending just above the antecubital
fossa. Some thrombus is also present in a branch of the basilic vein
within the medial forearm.

Brachial Veins: No evidence of thrombus. Normal compressibility,
respiratory phasicity and response to augmentation.

Radial Veins: No evidence of thrombus. Normal compressibility,
respiratory phasicity and response to augmentation.

Ulnar Veins: No evidence of thrombus. Normal compressibility,
respiratory phasicity and response to augmentation.

Venous Reflux:  None visualized.

Other Findings:  None visualized.
IMPRESSION: Superficial thrombophlebitis of the left cephalic and basilic veins
as well as a branch vein of the basilic vein in the left forearm.
The deep venous system is normally patent.

## 2022-08-10 ENCOUNTER — Other Ambulatory Visit (INDEPENDENT_AMBULATORY_CARE_PROVIDER_SITE_OTHER): Payer: Self-pay | Admitting: Nurse Practitioner

## 2022-08-10 DIAGNOSIS — R600 Localized edema: Secondary | ICD-10-CM

## 2022-08-13 ENCOUNTER — Encounter (INDEPENDENT_AMBULATORY_CARE_PROVIDER_SITE_OTHER): Payer: Self-pay

## 2022-08-13 ENCOUNTER — Encounter (INDEPENDENT_AMBULATORY_CARE_PROVIDER_SITE_OTHER): Payer: Self-pay | Admitting: Vascular Surgery

## 2022-09-17 ENCOUNTER — Encounter (INDEPENDENT_AMBULATORY_CARE_PROVIDER_SITE_OTHER): Payer: Self-pay

## 2022-11-30 DIAGNOSIS — I890 Lymphedema, not elsewhere classified: Secondary | ICD-10-CM | POA: Insufficient documentation

## 2022-11-30 DIAGNOSIS — I10 Essential (primary) hypertension: Secondary | ICD-10-CM | POA: Insufficient documentation

## 2022-11-30 NOTE — Progress Notes (Deleted)
MRN : [id number]  Kenneth Trevino is a 62 y.o. ([birth year]) male who presents with chief complaint of legs swell.  History of Present Illness:   Patient is seen for evaluation of leg swelling. The patient first noticed the swelling remotely but is now concerned because of a significant increase in the overall edema. The swelling isn't associated with significant pain.  There has been an increasing amount of  discoloration noted by the patient. The patient notes that in the morning the legs are improved but they steadily worsened throughout the course of the day. Elevation seems to make the swelling of the legs better, dependency makes them much worse.   There is no history of ulcerations associated with the swelling.   The patient denies any recent changes in their medications.  The patient has not been wearing graduated compression.  The patient has no had any past angiography, interventions or vascular surgery.  The patient denies a history of DVT or PE. There is no prior history of phlebitis. There is no history of primary lymphedema.  There is no history of radiation treatment to the groin or pelvis No history of malignancies. No history of trauma or groin or pelvic surgery. No history of foreign travel or parasitic infections area   No outpatient medications have been marked as taking for the 12/03/22 encounter (Appointment) with Delana Meyer, Dolores Lory, MD.    Past Medical History:  Diagnosis Date   Alcohol abuse    Anxiety    Depression    Gout    Hypertension     Past Surgical History:  Procedure Laterality Date   HERNIA REPAIR      Social History Social History   Tobacco Use   Smoking status: Some Days    Types: Cigarettes   Smokeless tobacco: Former  Substance Use Topics   Alcohol use: Yes    Comment: daily, consumes 2 half-gallons of liquor a week   Drug use: No    Family History No family history on file.  No Known Allergies   REVIEW OF SYSTEMS  (Negative unless checked)  Constitutional: []$ Weight loss  []$ Fever  []$ Chills Cardiac: []$ Chest pain   []$ Chest pressure   []$ Palpitations   []$ Shortness of breath when laying flat   []$ Shortness of breath with exertion. Vascular:  []$ Pain in legs with walking   [x]$ Pain in legs with standing  []$ History of DVT   []$ Phlebitis   [x]$ Swelling in legs   []$ Varicose veins   []$ Non-healing ulcers Pulmonary:   []$ Uses home oxygen   []$ Productive cough   []$ Hemoptysis   []$ Wheeze  []$ COPD   []$ Asthma Neurologic:  []$ Dizziness   []$ Seizures   []$ History of stroke   []$ History of TIA  []$ Aphasia   []$ Vissual changes   []$ Weakness or numbness in arm   []$ Weakness or numbness in leg Musculoskeletal:   []$ Joint swelling   []$ Joint pain   []$ Low back pain Hematologic:  []$ Easy bruising  []$ Easy bleeding   []$ Hypercoagulable state   []$ Anemic Gastrointestinal:  []$ Diarrhea   []$ Vomiting  []$ Gastroesophageal reflux/heartburn   []$ Difficulty swallowing. Genitourinary:  []$ Chronic kidney disease   []$ Difficult urination  []$ Frequent urination   []$ Blood in urine Skin:  []$ Rashes   []$ Ulcers  Psychological:  []$ History of anxiety   []$  History of major depression.  Physical Examination  There were no vitals filed for this visit. There is no height or weight on file to calculate BMI. Gen: WD/WN, NAD Head: [place]/AT, No temporalis wasting.  Ear/Nose/Throat: Hearing grossly intact, nares  w/o erythema or drainage, pinna without lesions Eyes: PER, EOMI, sclera nonicteric.  Neck: Supple, no gross masses.  No JVD.  Pulmonary:  Good air movement, no audible wheezing, no use of accessory muscles.  Cardiac: RRR, precordium not hyperdynamic. Vascular:  scattered varicosities present bilaterally.  Mild venous stasis changes to the legs bilaterally.  3-4+ soft pitting edema, CEAP [id number]  Vessel Right Left  Radial Palpable Palpable  Gastrointestinal: soft, non-distended. No guarding/no peritoneal signs.  Musculoskeletal: M/S 5/5 throughout.  No deformity.   Neurologic: CN 2-12 intact. Pain and light touch intact in extremities.  Symmetrical.  Speech is fluent. Motor exam as listed above. Psychiatric: Judgment intact, Mood & affect appropriate for pt's clinical situation. Dermatologic: Venous rashes no ulcers noted.  No changes consistent with cellulitis. Lymph : No lichenification or skin changes of chronic lymphedema.  CBC Lab Results  Component Value Date   WBC 8.9 04/29/2017   HGB 18.8 (H) 04/29/2017   HCT 53.8 (H) 04/29/2017   MCV 96.5 04/29/2017   PLT 225 04/29/2017    BMET    Component Value Date/Time   NA 142 04/29/2017 1106   K 3.4 (L) 04/29/2017 1106   CL 104 04/29/2017 1106   CO2 24 04/29/2017 1106   GLUCOSE 140 (H) 04/29/2017 1106   BUN 20 04/29/2017 1106   CREATININE 1.04 04/29/2017 1106   CALCIUM 9.3 04/29/2017 1106   GFRNONAA >60 04/29/2017 1106   GFRAA >60 04/29/2017 1106   CrCl cannot be calculated (Patient's most recent lab result is older than the maximum 21 days allowed.).  COAG No results found for: "INR", "PROTIME"  Radiology No results found.   Assessment/Plan There are no diagnoses linked to this encounter.   Hortencia Pilar, MD  11/30/2022 4:29 PM

## 2022-12-03 ENCOUNTER — Encounter (INDEPENDENT_AMBULATORY_CARE_PROVIDER_SITE_OTHER): Payer: Self-pay

## 2022-12-03 ENCOUNTER — Encounter (INDEPENDENT_AMBULATORY_CARE_PROVIDER_SITE_OTHER): Payer: Self-pay | Admitting: Vascular Surgery

## 2022-12-03 DIAGNOSIS — I890 Lymphedema, not elsewhere classified: Secondary | ICD-10-CM

## 2022-12-03 DIAGNOSIS — I10 Essential (primary) hypertension: Secondary | ICD-10-CM

## 2023-06-06 DIAGNOSIS — T7819XA Other adverse food reactions, not elsewhere classified, initial encounter: Secondary | ICD-10-CM | POA: Insufficient documentation

## 2023-06-06 DIAGNOSIS — F39 Unspecified mood [affective] disorder: Secondary | ICD-10-CM | POA: Insufficient documentation

## 2023-10-30 DIAGNOSIS — I340 Nonrheumatic mitral (valve) insufficiency: Secondary | ICD-10-CM | POA: Diagnosis not present

## 2023-10-30 DIAGNOSIS — I361 Nonrheumatic tricuspid (valve) insufficiency: Secondary | ICD-10-CM | POA: Diagnosis not present

## 2023-11-02 DIAGNOSIS — R7881 Bacteremia: Secondary | ICD-10-CM

## 2024-03-25 ENCOUNTER — Emergency Department (HOSPITAL_COMMUNITY)

## 2024-03-25 ENCOUNTER — Encounter (HOSPITAL_COMMUNITY): Payer: Self-pay

## 2024-03-25 ENCOUNTER — Emergency Department (HOSPITAL_COMMUNITY)
Admission: EM | Admit: 2024-03-25 | Discharge: 2024-03-25 | Disposition: A | Discharge status: Home / Self Care | Attending: Emergency Medicine | Admitting: Emergency Medicine

## 2024-03-25 DIAGNOSIS — I10 Essential (primary) hypertension: Secondary | ICD-10-CM | POA: Insufficient documentation

## 2024-03-25 DIAGNOSIS — N179 Acute kidney failure, unspecified: Secondary | ICD-10-CM | POA: Insufficient documentation

## 2024-03-25 DIAGNOSIS — Y908 Blood alcohol level of 240 mg/100 ml or more: Secondary | ICD-10-CM | POA: Diagnosis not present

## 2024-03-25 DIAGNOSIS — Z79899 Other long term (current) drug therapy: Secondary | ICD-10-CM | POA: Diagnosis not present

## 2024-03-25 DIAGNOSIS — F10920 Alcohol use, unspecified with intoxication, uncomplicated: Secondary | ICD-10-CM | POA: Insufficient documentation

## 2024-03-25 LAB — CBC WITH DIFFERENTIAL/PLATELET
Abs Immature Granulocytes: 0.01 10*3/uL (ref 0.00–0.07)
Basophils Absolute: 0.1 10*3/uL (ref 0.0–0.1)
Basophils Relative: 1 %
Eosinophils Absolute: 0.1 10*3/uL (ref 0.0–0.5)
Eosinophils Relative: 2 %
HCT: 41.9 % (ref 39.0–52.0)
Hemoglobin: 14.5 g/dL (ref 13.0–17.0)
Immature Granulocytes: 0 %
Lymphocytes Relative: 33 %
Lymphs Abs: 1.9 10*3/uL (ref 0.7–4.0)
MCH: 32.8 pg (ref 26.0–34.0)
MCHC: 34.6 g/dL (ref 30.0–36.0)
MCV: 94.8 fL (ref 80.0–100.0)
Monocytes Absolute: 0.4 10*3/uL (ref 0.1–1.0)
Monocytes Relative: 6 %
Neutro Abs: 3.3 10*3/uL (ref 1.7–7.7)
Neutrophils Relative %: 58 %
Platelets: 130 10*3/uL — ABNORMAL LOW (ref 150–400)
RBC: 4.42 MIL/uL (ref 4.22–5.81)
RDW: 16.5 % — ABNORMAL HIGH (ref 11.5–15.5)
WBC: 5.7 10*3/uL (ref 4.0–10.5)
nRBC: 0 % (ref 0.0–0.2)

## 2024-03-25 LAB — COMPREHENSIVE METABOLIC PANEL WITH GFR
ALT: 25 U/L (ref 0–44)
AST: 43 U/L — ABNORMAL HIGH (ref 15–41)
Albumin: 4 g/dL (ref 3.5–5.0)
Alkaline Phosphatase: 62 U/L (ref 38–126)
Anion gap: 13 (ref 5–15)
BUN: 18 mg/dL (ref 6–20)
CO2: 19 mmol/L — ABNORMAL LOW (ref 22–32)
Calcium: 8.4 mg/dL — ABNORMAL LOW (ref 8.9–10.3)
Chloride: 111 mmol/L (ref 98–111)
Creatinine, Ser: 1.91 mg/dL — ABNORMAL HIGH (ref 0.61–1.24)
GFR, Estimated: 40 mL/min — ABNORMAL LOW (ref >60)
Glucose, Bld: 97 mg/dL (ref 70–99)
Potassium: 3.8 mmol/L (ref 3.5–5.1)
Sodium: 143 mmol/L (ref 135–145)
Total Bilirubin: 1.2 mg/dL (ref 0.0–1.2)
Total Protein: 7.2 g/dL (ref 6.5–8.1)

## 2024-03-25 LAB — RAPID URINE DRUG SCREEN, HOSP PERFORMED
Amphetamines: NOT DETECTED
Barbiturates: NOT DETECTED
Benzodiazepines: NOT DETECTED
Cocaine: NOT DETECTED
Opiates: NOT DETECTED
Tetrahydrocannabinol: NOT DETECTED

## 2024-03-25 LAB — ETHANOL: Alcohol, Ethyl (B): 362 mg/dL (ref <15)

## 2024-03-25 LAB — CBG MONITORING, ED: Glucose-Capillary: 90 mg/dL (ref 70–99)

## 2024-03-25 MED ORDER — LORAZEPAM 2 MG/ML IJ SOLN
2.0000 mg | Freq: Once | INTRAMUSCULAR | Status: AC
  Administered 2024-03-25: 2 mg via INTRAMUSCULAR

## 2024-03-25 MED ORDER — LACTATED RINGERS IV BOLUS
1000.0000 mL | Freq: Once | INTRAVENOUS | Status: AC
  Administered 2024-03-25: 1000 mL via INTRAVENOUS

## 2024-03-25 MED ORDER — HALOPERIDOL LACTATE 5 MG/ML IJ SOLN
5.0000 mg | Freq: Once | INTRAMUSCULAR | Status: AC
  Administered 2024-03-25: 5 mg via INTRAMUSCULAR

## 2024-03-25 MED ORDER — HALOPERIDOL LACTATE 5 MG/ML IJ SOLN
5.0000 mg | Freq: Once | INTRAMUSCULAR | Status: DC
  Filled 2024-03-25: qty 1

## 2024-03-25 MED ORDER — LORAZEPAM 2 MG/ML IJ SOLN
2.0000 mg | Freq: Once | INTRAMUSCULAR | Status: DC
  Filled 2024-03-25: qty 1

## 2024-03-25 NOTE — ED Notes (Signed)
 IVC COMPLETE ORIGINAL IN RED FOLDER, ONE IN MEDICAL RECORDS DRAWER IN ORANGE AND 3 COPIES ATTACHED TO CLIPBOARD IN BLUE ZONE

## 2024-03-25 NOTE — ED Notes (Signed)
 Pt valuables (phone, keys and change) given to security. Pt belongings (sunglasses and 1 pair of shoes) put in locker.

## 2024-03-25 NOTE — ED Notes (Signed)
 Pt attempting to leave; educated pt that he is not allowed to leave yet; pt states "you can't make me stay"; security called

## 2024-03-25 NOTE — ED Notes (Signed)
 Pt pulling off monitoring devices

## 2024-03-25 NOTE — ED Notes (Signed)
Pt ambulated in the hall with no issues.

## 2024-03-25 NOTE — Discharge Instructions (Addendum)
 You were seen in the emergency department for your alcohol intoxication.  Your workup showed no injuries but you were dehydrated and your kidney function was elevated.  We gave you fluids in the ER.  You should have your kidney function rechecked by your primary doctor.  If you would like help with your alcohol use disorder, I have given you outpatient resources or you can go to the behavioral health urgent care.  You should return to the emergency department for any new or concerning symptoms.

## 2024-03-25 NOTE — ED Triage Notes (Addendum)
 Pt bib [facility] after [place] [employer] [place]. Fire dept got there and patient was sitting on ground. EMS reports patient endorses alcohol use today and is confused. 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 and repeatedly asking EMS for alcohol. Pt very irritable during triage process, stating he does not feel like he needs to be here. During triage, pt alert and oriented x3 but disoriented to situation.   EMS vitals:  97 cbg 140/90 100% RA 250cc NS 18 Right forearm

## 2024-03-25 NOTE — ED Notes (Signed)
 Pt walking in hall; this rn instructed pt to go back to room; pt states "I want to leave"; advised pt that he cannot leave yet; pt helped back onto stretcher, attempted to place monitoring devices on pt; pt states "I don't need all those"; pt agreeable to wearing pulse ox

## 2024-03-25 NOTE — ED Notes (Signed)
 Pt brother called for a update. Number in the chart.

## 2024-03-25 NOTE — ED Provider Notes (Signed)
 [address] EMERGENCY DEPARTMENT AT [facility] Provider Note   CSN: [id number] Arrival date & time: 03/25/24  1529     History  Chief Complaint  Patient presents with   Alcohol Intoxication    Kenneth Trevino is a 63 y.o. male.  Patient is a 63 year old male with a past medical history of hypertension, depression and alcohol use disorder presenting to the emergency department with alcohol intoxication.  Per EMS, 911 was [place].  He was unsure how he got there or how long he was there but did endorse drinking a significant amount of alcohol today.  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  EMS reports that he was confused and route with repetitive questioning.  Patient states that he is having diffuse body pain all over.  The history is provided by the patient and the EMS personnel. The history is limited by the condition of the patient (Intoxication).  Alcohol Intoxication       Home Medications Prior to Admission medications   Medication Sig Start Date End Date Taking? Authorizing Provider  ALPRAZolam  (XANAX ) 0.25 MG tablet Take 1 tablet (0.25 mg total) by mouth 3 (three) times daily as needed for anxiety. 06/06/15   Renold Cashing, MD  amLODipine  (NORVASC ) 10 MG tablet Take 1 tablet (10 mg total) by mouth daily. 04/29/17   Isa Manuel, MD  cloNIDine  (CATAPRES ) 0.1 MG tablet Take 0.1 mg by mouth 2 (two) times daily.    [provider]  lisinopril -hydrochlorothiazide  (PRINZIDE ,ZESTORETIC ) 20-12.5 MG tablet Take 1 tablet by mouth daily. 04/29/17   Isa Manuel, MD  testosterone cypionate (DEPOTESTOSTERONE CYPIONATE) 200 MG/ML injection Inject 100 mg into the muscle every 14 (fourteen) days.    [provider]      Allergies    Patient has no known allergies.    Review of Systems   Review of Systems  Physical Exam Updated Vital Signs BP (!) 141/88 (BP  Location: Right Arm)   Pulse 78   Temp (!) 97.5 F (36.4 C) (Oral)   Resp 18   SpO2 100%  Physical Exam Vitals and nursing note reviewed.  Constitutional:      General: He is not in acute distress.    Appearance: Normal appearance.     Comments: Acutely intoxicated appearing  HENT:     Head: Normocephalic and atraumatic.     Nose: Nose normal.     Mouth/Throat:     Mouth: Mucous membranes are moist.     Pharynx: Oropharynx is clear.  Eyes:     Extraocular Movements: Extraocular movements intact.     Conjunctiva/sclera: Conjunctivae normal.     Pupils: Pupils are equal, round, and reactive to light.  Neck:     Comments: No midline neck tenderness Cardiovascular:     Rate and Rhythm: Normal rate and regular rhythm.     Heart sounds: Normal heart sounds.  Pulmonary:     Effort: Pulmonary effort is normal.     Breath sounds: Normal breath sounds.  Abdominal:     General: Abdomen is flat.     Palpations: Abdomen is soft.     Tenderness: There is no abdominal tenderness.  Musculoskeletal:        General: Normal range of motion.     Cervical back: Normal range of motion and neck supple.     Comments: No midline back tenderness No  bony tenderness to bilateral UE or LE  Skin:    General: Skin is warm and dry.  Neurological:     Mental Status: He is alert.     Sensory: No sensory deficit.     Motor: No weakness.     Comments: Oriented to person, place and time, not events  Psychiatric:     Comments: Agitated, threatening staff, uncooperative     ED Results / Procedures / Treatments   Labs (all labs ordered are listed, but only abnormal results are displayed) Labs Reviewed  CBC WITH DIFFERENTIAL/PLATELET - Abnormal; Notable for the following components:      Result Value   RDW 16.5 (*)    Platelets 130 (*)    All other components within normal limits  COMPREHENSIVE METABOLIC PANEL WITH GFR - Abnormal; Notable for the following components:   CO2 19 (*)    Creatinine,  Ser 1.91 (*)    Calcium 8.4 (*)    AST 43 (*)    GFR, Estimated 40 (*)    All other components within normal limits  ETHANOL - Abnormal; Notable for the following components:   Alcohol, Ethyl (B) 362 (*)    All other components within normal limits  RAPID URINE DRUG SCREEN, HOSP PERFORMED  CBG MONITORING, ED  CBG MONITORING, ED    EKG EKG Interpretation Date/Time:  Wednesday Mar 25 2024 16:46:55 EDT Ventricular Rate:  81 PR Interval:  220 QRS Duration:  109 QT Interval:  401 QTC Calculation: 466 R Axis:   -6  Text Interpretation: Sinus rhythm Prolonged PR interval No significant change since last tracing Confirmed by Celesta Coke ([id number]) on 03/25/2024 5:01:08 PM  Radiology CT Head Wo Contrast Result Date: 03/25/2024 CLINICAL DATA:  Head trauma, abnormal mental status EXAM: CT HEAD WITHOUT CONTRAST TECHNIQUE: Contiguous axial images were obtained from the base of the skull through the vertex without intravenous contrast. RADIATION DOSE REDUCTION: This exam was performed according to the departmental dose-optimization program which includes automated exposure control, adjustment of the mA and/or kV according to patient size and/or use of iterative reconstruction technique. COMPARISON:  CT head 10/31/2023 FINDINGS: Brain: No intracranial hemorrhage, mass effect, or evidence of acute infarct. No hydrocephalus. No extra-axial fluid collection. Vascular: No hyperdense vessel or unexpected calcification. Skull: No fracture or focal lesion. Sinuses/Orbits: No acute finding. Other: None. IMPRESSION: No acute intracranial abnormality. Electronically Signed   By: Rozell Cornet M.D.   On: 03/25/2024 19:16    Procedures .Critical Care  Performed by: Kingsley, Hilberto Burzynski K, DO Authorized by: Nolberto Batty, DO   Critical care provider statement:    Critical care time (minutes):  30   Critical care was necessary to treat or prevent imminent or life-threatening deterioration of the  following conditions:  Toxidrome   Critical care was time spent personally by me on the following activities:  Development of treatment plan with patient or surrogate, discussions with consultants, evaluation of patient's response to treatment, examination of patient, ordering and review of laboratory studies, ordering and review of radiographic studies, ordering and performing treatments and interventions, pulse oximetry, re-evaluation of patient's condition and review of old charts     Medications Ordered in ED Medications  haloperidol lactate (HALDOL) injection 5 mg (5 mg Intramuscular Given 03/25/24 1606)  LORazepam  (ATIVAN ) injection 2 mg (2 mg Intramuscular Given 03/25/24 1606)  lactated ringers bolus 1,000 mL (0 mLs Intravenous Stopped 03/25/24 2033)    ED Course/ Medical Decision Making/ A&P Clinical Course as of  03/25/24 2053  Wed Mar 25, 2024  1822 Cr 1.9 from baseline 1.4, ETOH level 362. Will be given IVF. CTH is pending. [VK]  1959 Upon reassessment, patient calm and cooperative A&Ox4, requesting to leave. He denies SI or HI. Has clear thought patterns. He appears much more clinically sober. Will plan to ambulate after fluids complete. Will rescind IVC.  [VK]  2053 Patient able to ambulate steadily. He's stable for discharge home with outpatient follow up. [VK]    Clinical Course User Index [VK] Kingsley, Neyra Pettie K, DO                                 Medical Decision Making This patient presents to the ED with chief complaint(s) of intoxication with pertinent past medical history of HTN, depression, ETOH use disorder which further complicates the presenting complaint. The complaint involves an extensive differential diagnosis and also carries with it a high risk of complications and morbidity.    The differential diagnosis includes intoxication, ICH, mass effect, hypo or hyperglycemia, electrolyte derangement  Additional history obtained: Additional history obtained from EMS   Records reviewed Care Everywhere/External Records  ED Course and Reassessment: On patient's arrival he is hemodynamically stable, agitated and uncooperative with staff.  He is disoriented to the events from today and does appear to be acutely intoxicated, threatening to leave.  With patient's acute intoxication and threats towards staff, IVC was filed by myself.  He did require Ativan  and Haldol for sedation to facilitate workup and evaluation.  Independent labs interpretation:  The following labs were independently interpreted: elevated ETOH level, AKI  Independent visualization of imaging: - I independently visualized the following imaging with scope of interpretation limited to determining acute life threatening conditions related to emergency care: CTH, which revealed no acute disease  Consultation: - Consulted or discussed management/test interpretation w/ external professional: N/A  Consideration for admission or further workup: Patient has no emergent conditions requiring admission or further work-up at this time and is stable for discharge home with primary care follow-up  Social Determinants of health: ETOH use disorder    Amount and/or Complexity of Data Reviewed Labs: ordered. Radiology: ordered.  Risk Prescription drug management.          Final Clinical Impression(s) / ED Diagnoses Final diagnoses:  Alcoholic intoxication without complication (HCC)  AKI (acute kidney injury) [facility])    Rx / DC Orders ED Discharge Orders     None         Kingsley, Momina Hunton K, DO 03/25/24 2053

## 2024-03-25 NOTE — ED Notes (Signed)
 Envelope #[id number] has been submitted successfully. IVC RESCIND

## 2024-03-25 NOTE — ED Notes (Signed)
 IVC HAS BEEN EFILED AWAITING MAGISTRATE CONFIRMATION ENVELOPE#[id number]

## 2024-03-25 NOTE — ED Notes (Signed)
 IVC ACCEPTED AWAITING FINDINGS AND CUSTODY ORDER CASE #[id number] EXP 5/14

## 2024-03-25 NOTE — ED Notes (Signed)
 Pt making lewd comments to this rn during triage; when this rn asked pt a question, pt states "turn around and I'll tell you"; and "you gonna suck it?"

## 2024-03-25 NOTE — ED Notes (Signed)
 Patient transported to CT

## 2024-03-25 NOTE — ED Notes (Signed)
 EDP notified of patient arrival. EDP at bedside.

## 2024-03-25 NOTE — ED Notes (Signed)
 Sitter at bedside.

## 2024-09-21 DIAGNOSIS — I517 Cardiomegaly: Secondary | ICD-10-CM | POA: Diagnosis not present

## 2024-09-23 DIAGNOSIS — I517 Cardiomegaly: Secondary | ICD-10-CM

## 2024-09-25 DIAGNOSIS — I2510 Atherosclerotic heart disease of native coronary artery without angina pectoris: Secondary | ICD-10-CM

## 2024-09-29 ENCOUNTER — Ambulatory Visit: Payer: Self-pay

## 2024-09-29 NOTE — Telephone Encounter (Signed)
 FYI Only or Action Required?: FYI only for provider: appointment scheduled on 10/09/24.  Patient was last seen in primary care on Not established with [facility].  Called Nurse Triage reporting Leg Pain.  Symptoms began several years ago.  Interventions attempted: OTC medications: Ibuprofen and tylenol .  Symptoms are: unchanged.  Triage Disposition: See PCP Within 2 Weeks  Patient/caregiver understands and will follow disposition?: Yes  Copied from CRM [id number]. Topic: Clinical - Red Word Triage >> Sep 29, 2024  9:30 AM Kenneth Trevino wrote: Red Word that prompted transfer to Nurse Triage: pain in legs and knees. Reason for Disposition . Knee pain is a chronic symptom (recurrent or ongoing AND present > 4 weeks)  Answer Assessment - Initial Assessment Questions Pt reports chronic hx of bilateral knee, ankle and foot pain for past 8-10 years. Would like to be established with new PCP to manage leg pain and care overall. Reports taking advil 200 mg tablet for pain, between 4-12 per day. Education provided regarding risks organ damage from taking too much ibuprofen and to alternate with tylenol  instead. Pt receptive. Scheduled NP appt Friday 11/21 with provider at [facility]. Advised UC or ED for worsening symptoms.   1. LOCATION and RADIATION: Where is the pain located?      Both knees and both ankles and feet  2. QUALITY: What does the pain feel like?  (e.g., sharp, dull, aching, burning)     General pain  3. SEVERITY: How bad is the pain? What does it keep you from doing?   (Scale 1-10; or mild, moderate, severe)     3/10 at rest, 10/10 when walking. Using cane mostly, sometimes FWW.  4. ONSET: When did the pain start? Does it come and go, or is it there all the time?     10 years ago  5. RECURRENT: Have you had this pain before? If Yes, ask: When, and what happened then?     Ongoing past 10 yrs  6. SETTING: Has there been any recent work, exercise or  other activity that involved that part of the body?      Denies  7. AGGRAVATING FACTORS: What makes the knee pain worse? (e.g., walking, climbing stairs, running)     Walking  8. ASSOCIATED SYMPTOMS: Is there any swelling or redness of the knee?     Swelling when standing too long  9. OTHER SYMPTOMS: Do you have any other symptoms? (e.g., calf pain, chest pain, difficulty breathing, fever)     No fever. Reports falling twice in the past week, did not hit head or sustain any injuries besides scrapped elbow.  Protocols used: Knee Pain-A-AH

## 2024-10-09 ENCOUNTER — Ambulatory Visit (INDEPENDENT_AMBULATORY_CARE_PROVIDER_SITE_OTHER): Admitting: Family Medicine

## 2024-10-09 ENCOUNTER — Encounter (HOSPITAL_BASED_OUTPATIENT_CLINIC_OR_DEPARTMENT_OTHER): Payer: Self-pay | Admitting: Family Medicine

## 2024-10-09 VITALS — BP 151/79 | HR 53 | Temp 98.3°F | Resp 12 | Wt 212.0 lb

## 2024-10-09 DIAGNOSIS — Z125 Encounter for screening for malignant neoplasm of prostate: Secondary | ICD-10-CM

## 2024-10-09 DIAGNOSIS — F1020 Alcohol dependence, uncomplicated: Secondary | ICD-10-CM | POA: Insufficient documentation

## 2024-10-09 DIAGNOSIS — I10 Essential (primary) hypertension: Secondary | ICD-10-CM | POA: Diagnosis not present

## 2024-10-09 DIAGNOSIS — R5383 Other fatigue: Secondary | ICD-10-CM | POA: Diagnosis not present

## 2024-10-09 DIAGNOSIS — M25562 Pain in left knee: Secondary | ICD-10-CM

## 2024-10-09 DIAGNOSIS — G8929 Other chronic pain: Secondary | ICD-10-CM | POA: Insufficient documentation

## 2024-10-09 DIAGNOSIS — M25561 Pain in right knee: Secondary | ICD-10-CM | POA: Diagnosis not present

## 2024-10-09 DIAGNOSIS — M1A09X0 Idiopathic chronic gout, multiple sites, without tophus (tophi): Secondary | ICD-10-CM

## 2024-10-09 DIAGNOSIS — E785 Hyperlipidemia, unspecified: Secondary | ICD-10-CM

## 2024-10-09 NOTE — Assessment & Plan Note (Signed)
 I urged f/u locally with [facility] and provided a pamphlet.  Will discuss further once he is more comfortable.

## 2024-10-09 NOTE — Assessment & Plan Note (Signed)
 Prompt Ortho referral.  Continue using the cane with fall precautions.

## 2024-10-09 NOTE — Progress Notes (Signed)
 New Patient Office Visit  Subjective    Patient ID: Kenneth Trevino, male    DOB: Nov 29, 1960  Age: 63 y.o. MRN: [id number]  CC: No chief complaint on file.   HPI Kenneth Trevino presents to establish care F/u as above.  New to my practice.  He is frustrated with chronic worsening pain in both knees and ankles.  Knees bother him the most.  No h/o trauma or falls.  Limits his mobility.  Some imbalance noted.  Needs a cane for his walking.  Has seen the PA's at [facility].  He hasn't improved despite gel injections to his knees in the past.  Outpatient Encounter Medications as of 10/09/2024  Medication Sig   amLODipine  (NORVASC ) 10 MG tablet Take 1 tablet (10 mg total) by mouth daily.   colchicine 0.6 MG tablet Take by mouth.   ALPRAZolam  (XANAX ) 0.25 MG tablet Take 1 tablet (0.25 mg total) by mouth 3 (three) times daily as needed for anxiety. (Patient not taking: Reported on 10/09/2024)   cloNIDine  (CATAPRES ) 0.1 MG tablet Take 0.1 mg by mouth 2 (two) times daily. (Patient not taking: Reported on 10/09/2024)   lisinopril -hydrochlorothiazide  (PRINZIDE ,ZESTORETIC ) 20-12.5 MG tablet Take 1 tablet by mouth daily. (Patient not taking: Reported on 10/09/2024)   testosterone cypionate (DEPOTESTOSTERONE CYPIONATE) 200 MG/ML injection Inject 100 mg into the muscle every 14 (fourteen) days. (Patient not taking: Reported on 10/09/2024)   No facility-administered encounter medications on file as of 10/09/2024.    Past Medical History:  Diagnosis Date   Alcoholism (HCC)    Anxiety    Colonoscopy refused    Depression    Former smoker    Minimal, light   Gait disorder    Gout    Hypertension    Osteoarthritis     Past Surgical History:  Procedure Laterality Date   HERNIA REPAIR      History reviewed. No pertinent family history.  Social History   Tobacco Use   Smoking status: Some Days    Types: Cigarettes   Smokeless tobacco: Former  Building Services Engineer status: Never Used   Substance Use Topics   Alcohol use: Yes    Comment: Chronic h/o abuse.  Currently not drinking.   Drug use: No    Review of Systems  Constitutional:  Positive for malaise/fatigue. Negative for diaphoresis, fever and weight loss.  Respiratory:  Negative for cough, shortness of breath and wheezing.   Cardiovascular:  Negative for chest pain, palpitations, orthopnea, claudication, leg swelling and PND.        Objective    BP (!) 151/79 (Cuff Size: Large)   Pulse (!) 53   Temp 98.3 F (36.8 C) (Oral)   Resp 12   Wt 212 lb (96.2 kg)   SpO2 98%   BMI 28.75 kg/m   Physical Exam Constitutional:      General: He is not in acute distress.    Appearance: Normal appearance.     Comments: Gait is stiff, but he walks relatively well with a cane.  HENT:     Head: Normocephalic.  Neck:     Vascular: No carotid bruit.  Cardiovascular:     Rate and Rhythm: Normal rate and regular rhythm.     Pulses: Normal pulses.     Heart sounds: Normal heart sounds.  Pulmonary:     Effort: Pulmonary effort is normal.     Breath sounds: Normal breath sounds.  Abdominal:     General: Bowel sounds are  normal.     Palpations: Abdomen is soft.  Musculoskeletal:     Cervical back: Neck supple. No tenderness.     Right lower leg: No edema.     Left lower leg: No edema.     Comments: Left knee with good ROM.  No instability appreciated.  He declined to disrobe for a thorough exam.  Neurological:     Mental Status: He is alert.         Assessment & Plan:  Chronic pain of both knees Assessment & Plan: Prompt Ortho referral.  Continue using the cane with fall precautions.  Orders: -     Ambulatory referral to Orthopedic Surgery  Essential hypertension Assessment & Plan: DASH diet.  Possibly exacerbated by alcohol abuse.  Monitor carefully.  Request old records.  Orders: -     CBC with Differential/Platelet -     Comprehensive metabolic panel with GFR  Alcoholism (HCC) Assessment &  Plan: I urged f/u locally with [facility] and provided a pamphlet.  Will discuss further once he is more comfortable.   Dyslipidemia -     Lipid panel  Fatigue, unspecified type -     TSH -     Vitamin B12  Chronic gout of multiple sites, unspecified cause -     Uric acid  Prostate cancer screening -     PSA    Return in about 4 weeks (around 11/06/2024) for chronic follow-up.   REDDING PONCE NORLEEN FALCON., MD

## 2024-10-09 NOTE — Assessment & Plan Note (Addendum)
 DASH diet.  Possibly exacerbated by alcohol abuse.  Monitor carefully.  Request old records.

## 2024-10-10 LAB — COMPREHENSIVE METABOLIC PANEL WITH GFR
ALT: 49 IU/L — ABNORMAL HIGH (ref 0–44)
AST: 44 IU/L — ABNORMAL HIGH (ref 0–40)
Albumin: 4.5 g/dL (ref 3.9–4.9)
Alkaline Phosphatase: 109 IU/L (ref 47–123)
BUN/Creatinine Ratio: 15 (ref 10–24)
BUN: 22 mg/dL (ref 8–27)
Bilirubin Total: 1 mg/dL (ref 0.0–1.2)
CO2: 21 mmol/L (ref 20–29)
Calcium: 9.2 mg/dL (ref 8.6–10.2)
Chloride: 104 mmol/L (ref 96–106)
Creatinine, Ser: 1.49 mg/dL — ABNORMAL HIGH (ref 0.76–1.27)
Globulin, Total: 2.3 g/dL (ref 1.5–4.5)
Glucose: 97 mg/dL (ref 70–99)
Potassium: 4 mmol/L (ref 3.5–5.2)
Sodium: 142 mmol/L (ref 134–144)
Total Protein: 6.8 g/dL (ref 6.0–8.5)
eGFR: 53 mL/min/1.73 — ABNORMAL LOW (ref >59)

## 2024-10-10 LAB — TSH: TSH: 1.39 u[IU]/mL (ref 0.450–4.500)

## 2024-10-10 LAB — LIPID PANEL
Chol/HDL Ratio: 3.2 ratio (ref 0.0–5.0)
Cholesterol, Total: 116 mg/dL (ref 100–199)
HDL: 36 mg/dL — ABNORMAL LOW (ref >39)
LDL Chol Calc (NIH): 67 mg/dL (ref 0–99)
Triglycerides: 62 mg/dL (ref 0–149)
VLDL Cholesterol Cal: 13 mg/dL (ref 5–40)

## 2024-10-10 LAB — CBC WITH DIFFERENTIAL/PLATELET
Basophils Absolute: 0.1 x10E3/uL (ref 0.0–0.2)
Basos: 2 %
EOS (ABSOLUTE): 0.1 x10E3/uL (ref 0.0–0.4)
Eos: 2 %
Hematocrit: 43 % (ref 37.5–51.0)
Hemoglobin: 13.9 g/dL (ref 13.0–17.7)
Immature Grans (Abs): 0 x10E3/uL (ref 0.0–0.1)
Immature Granulocytes: 0 %
Lymphocytes Absolute: 1.1 x10E3/uL (ref 0.7–3.1)
Lymphs: 28 %
MCH: 30.3 pg (ref 26.6–33.0)
MCHC: 32.3 g/dL (ref 31.5–35.7)
MCV: 94 fL (ref 79–97)
Monocytes Absolute: 0.3 x10E3/uL (ref 0.1–0.9)
Monocytes: 8 %
Neutrophils Absolute: 2.4 x10E3/uL (ref 1.4–7.0)
Neutrophils: 60 %
Platelets: 125 x10E3/uL — ABNORMAL LOW (ref 150–450)
RBC: 4.59 x10E6/uL (ref 4.14–5.80)
RDW: 15.5 % — ABNORMAL HIGH (ref 11.6–15.4)
WBC: 4 x10E3/uL (ref 3.4–10.8)

## 2024-10-10 LAB — VITAMIN B12: Vitamin B-12: 482 pg/mL (ref 232–1245)

## 2024-10-10 LAB — PSA: Prostate Specific Ag, Serum: 0.4 ng/mL (ref 0.0–4.0)

## 2024-10-10 LAB — URIC ACID: Uric Acid: 9.4 mg/dL — ABNORMAL HIGH (ref 3.8–8.4)

## 2024-10-12 ENCOUNTER — Encounter (HOSPITAL_BASED_OUTPATIENT_CLINIC_OR_DEPARTMENT_OTHER): Payer: Self-pay | Admitting: Family Medicine

## 2024-10-12 ENCOUNTER — Ambulatory Visit (HOSPITAL_BASED_OUTPATIENT_CLINIC_OR_DEPARTMENT_OTHER): Payer: Self-pay | Admitting: Family Medicine

## 2024-10-19 ENCOUNTER — Telehealth (HOSPITAL_BASED_OUTPATIENT_CLINIC_OR_DEPARTMENT_OTHER): Payer: Self-pay | Admitting: Family Medicine

## 2024-10-19 NOTE — Telephone Encounter (Signed)
 Copied from CRM [id number]. Topic: Referral - Question >> Oct 19, 2024  2:55 PM Leonette P wrote: Reason for CRM: Pt called saying he has not heard anything back from Dr. Dwan office about an ortho appt.  [phone]

## 2024-10-29 ENCOUNTER — Inpatient Hospital Stay (HOSPITAL_BASED_OUTPATIENT_CLINIC_OR_DEPARTMENT_OTHER): Admitting: Family Medicine

## 2024-10-29 ENCOUNTER — Ambulatory Visit: Payer: Self-pay

## 2024-10-29 ENCOUNTER — Telehealth (HOSPITAL_BASED_OUTPATIENT_CLINIC_OR_DEPARTMENT_OTHER): Payer: Self-pay

## 2024-10-29 NOTE — Telephone Encounter (Signed)
 Karna Oppenheim with [facility] nurse called stating pt is too intoxicated to come in visit today. He was supposed to be seen for a hospital follow up. Pt stated he could not come into office, cannot drive, has nobody to take him anywhere. Pt refuses to go to ER. Spoke to Dr. Dottie verbally and he stated needs to go to ER and get IV fluids and detox.

## 2024-10-29 NOTE — Telephone Encounter (Signed)
 Please contact patient. Cancelled appt today as he does not have transportation and has been drinking alcohol. Thank you.  FYI Only or Action Required?: Action required by provider: clinical question for provider.  Patient was last seen in primary care on 10/09/2024 by Dottie Norleen [name], MD.  Called Nurse Triage reporting Alcohol Problem.  Symptoms began Chronic.  Interventions attempted: Other: alcohol.  Symptoms are: unchanged.  Triage Disposition: Call PCP Within 24 Hours  Patient/caregiver understands and will follow disposition?:  Reason for Disposition  Requesting admission for substance use (drug use), addiction, or withdrawal  Answer Assessment - Initial Assessment Questions When asked about symptoms, pt states I feel like I am going to die. Denies SI or HI. Denies nausea or headache. States it's just a bad bad feeling. When asked about chest pain, he states he feels a heaviness since November. Confirmed again that this sensation and chest pressure began in November, resolves with alcohol. Denies nausea or vomiting, states he has the shakes between drinks, but resolves with alcohol.   When asked how many drinks he has per day, he says many. Typically drinks approximately one gallon of vodka.  Pt's concern is running out of alcohol because of r/o severe withdrawal. Advised it that occurs, to call 911 - especially if he experiences any shakes or confusion.  Patient called initially to cancel his appointment today. States he cannot drive. He states that he has his brother as his support person but does not want to bother him.  This RN attempted to assist patient with accessing MyChart with MyChart support team but pt was unable to gain access. This RN called CAL, CMA states she will discuss with PCP and contact the patient. This RN has also reached out to [facility] for call back.   1. DRUG: What drug(s) are you using?      Alcohol 2. ROUTE: How are you using this  drug? (e.g., swallow, smoke, inject, huff, snort)     Oral 3. HOW OFTEN: How many days per week do you typically use it?     Daily 4. HOW MUCH: How much do you use?      Approximately one gallon per day 6. ALCOHOL USE PROBLEM: Do you have or have you ever had a problem with drinking too much alcohol?     Yes, states has been drinking forever 7. SYMPTOMS: What symptoms are you currently experiencing? (e.g., none; abdomen pain, chest pain, headache, vomiting)     See above 8. TREATMENT PROGRAM: Have you ever gone through a substance use treatment program?     Denies  Protocols used: Substance Use and Problems-A-AH  Copied from CRM #[id number]. Topic: Clinical - Red Word Triage >> Oct 29, 2024 10:21 AM Charlet HERO wrote: Red Word that prompted transfer to Nurse Triage: Patient is calling he has appt today and he is having achol withdrawal.Felling really bad. [employer]

## 2024-11-06 ENCOUNTER — Ambulatory Visit (HOSPITAL_BASED_OUTPATIENT_CLINIC_OR_DEPARTMENT_OTHER): Admitting: Family Medicine

## 2024-11-13 ENCOUNTER — Ambulatory Visit (INDEPENDENT_AMBULATORY_CARE_PROVIDER_SITE_OTHER): Admitting: Family Medicine

## 2024-11-13 ENCOUNTER — Encounter (HOSPITAL_BASED_OUTPATIENT_CLINIC_OR_DEPARTMENT_OTHER): Payer: Self-pay | Admitting: Family Medicine

## 2024-11-13 VITALS — BP 138/86 | HR 62 | Temp 98.3°F | Resp 16 | Ht 72.0 in | Wt 206.1 lb

## 2024-11-13 DIAGNOSIS — D696 Thrombocytopenia, unspecified: Secondary | ICD-10-CM

## 2024-11-13 DIAGNOSIS — N1831 Chronic kidney disease, stage 3a: Secondary | ICD-10-CM

## 2024-11-13 DIAGNOSIS — G8929 Other chronic pain: Secondary | ICD-10-CM

## 2024-11-13 DIAGNOSIS — N183 Chronic kidney disease, stage 3 unspecified: Secondary | ICD-10-CM | POA: Insufficient documentation

## 2024-11-13 DIAGNOSIS — M25562 Pain in left knee: Secondary | ICD-10-CM

## 2024-11-13 DIAGNOSIS — F1020 Alcohol dependence, uncomplicated: Secondary | ICD-10-CM

## 2024-11-13 DIAGNOSIS — M25561 Pain in right knee: Secondary | ICD-10-CM

## 2024-11-13 DIAGNOSIS — K7030 Alcoholic cirrhosis of liver without ascites: Secondary | ICD-10-CM | POA: Diagnosis not present

## 2024-11-13 NOTE — Assessment & Plan Note (Signed)
 Recent relapse with severe withdrawal symptoms. Currently attending [facility] for support, receiving medications for withdrawal symptoms, anxiety, and sleep. Considering intensive outpatient program (IOP) and potential inpatient rehab if financially feasible. Acknowledges inability to quit alone and is compliant with [facility]'s program. - Continue attending [facility] for support and medication management. - Consider intensive outpatient program (IOP) for additional support. - Discuss potential inpatient rehab options with [facility] if financially feasible.

## 2024-11-13 NOTE — Progress Notes (Signed)
 "  Established Patient Office Visit  Subjective   Patient ID: Kenneth Trevino, male    DOB: 02/26/1961  Age: 63 y.o. MRN: [id number]  Chief Complaint  Patient presents with   Hospitalization Follow-up    Hospital follow-up     Discussed the use of AI scribe software for clinical note transcription with the patient, who gave verbal consent to proceed.  History of Present Illness Kenneth Trevino is a 63 year old male with alcohol use disorder who presents for follow-up regarding his recent relapse and treatment progress.  He experienced a recent relapse in alcohol use, resulting in multiple emergency care visits due to ineffective self-management of withdrawal symptoms. He is currently engaged with [facility] for treatment, where he has been prescribed medications to curb alcohol cravings, manage withdrawal symptoms, and aid sleep. He is satisfied with the treatment and is improved.  He has a history of mild CKD and Alcoholic Hepatitis. Financial constraints have prevented him from seeing a liver specialist. He has a history of low platelet counts attributed to alcohol use. He experiences coldness, which he associates with withdrawal rather than platelet issues.  He has a past diagnosis of alpha-gal syndrome, which has resolved, allowing him to consume red meat again.  He experiences significant knee pain, which has been managed with cortisone injections in the past. He has difficulty with mobility and is seeking further orthopedic evaluation.  No knee trauma.    Past Medical History:  Diagnosis Date   ALC (alcoholic liver cirrhosis) (HCC)    Hepatology referral pending   Alcoholic peripheral neuropathy    Alcoholism (HCC)    f/by [facility]   Anxiety    CKD (chronic kidney disease) stage 3, GFR 30-59 ml/min (HCC)    Colonoscopy refused    Depression    Former smoker    Minimal, light   Gait disorder    Gout    Hypertension    OSA (obstructive sleep apnea)    Rx declined    Osteoarthritis     Outpatient Encounter Medications as of 11/13/2024  Medication Sig   acamprosate (CAMPRAL) 333 MG tablet Take 666 mg by mouth 3 (three) times daily.   Allopurinol 200 MG TABS allopurinol 1 PO QD for gout   amLODipine  (NORVASC ) 10 MG tablet Take 1 tablet (10 mg total) by mouth daily.   amLODipine -valsartan (EXFORGE) 10-160 MG tablet amlodipine  and Valsartan 5mg /160mg  1 PO QD bedtime   chlordiazePOXIDE (LIBRIUM) 25 MG capsule Take by mouth.   colchicine 0.6 MG tablet Take by mouth.   folic acid  (FOLVITE ) 1 MG tablet Take 1 mg by mouth daily.   naltrexone (DEPADE) 50 MG tablet Take 50 mg by mouth at bedtime.   potassium chloride  (KLOR-CON ) 20 MEQ packet daily. as directed   sucralfate (CARAFATE) 1 GM/10ML suspension TAKE 1 GM ( ) BY MOUTH BEFORE MEALS AS NEEDED AT BEDTIME MAY SUBSTITUTE FOR TABLETS IF CHEAPER   thiamine  (VITAMIN B1) 100 MG tablet TAKE 1 TABLET BY MOUTH EVERY DAY AT 12 PM NOON   ALPRAZolam  (XANAX ) 0.25 MG tablet Take 1 tablet (0.25 mg total) by mouth 3 (three) times daily as needed for anxiety. (Patient not taking: Reported on 10/09/2024)   cloNIDine  (CATAPRES ) 0.1 MG tablet Take 0.1 mg by mouth 2 (two) times daily. (Patient not taking: Reported on 10/09/2024)   lisinopril -hydrochlorothiazide  (PRINZIDE ,ZESTORETIC ) 20-12.5 MG tablet Take 1 tablet by mouth daily. (Patient not taking: Reported on 10/09/2024)   testosterone cypionate (DEPOTESTOSTERONE CYPIONATE) 200 MG/ML injection Inject 100  mg into the muscle every 14 (fourteen) days. (Patient not taking: Reported on 10/09/2024)   No facility-administered encounter medications on file as of 11/13/2024.    Social History   Tobacco Use   Smoking status: Some Days    Types: Cigarettes   Smokeless tobacco: Former  Building Services Engineer status: Never Used  Substance Use Topics   Alcohol use: Yes    Comment: Chronic h/o abuse.  Currently not drinking.   Drug use: No      Review of Systems   Constitutional:  Negative for diaphoresis, fever, malaise/fatigue and weight loss.  Respiratory:  Negative for cough, shortness of breath and wheezing.   Cardiovascular:  Negative for chest pain, palpitations, orthopnea, claudication, leg swelling and PND.      Objective:     BP 138/86 (BP Location: Right Arm, Patient Position: Sitting, Cuff Size: Normal)   Pulse 62   Temp 98.3 F (36.8 C) (Oral)   Resp 16   Ht 6' (1.829 m)   Wt 206 lb 1.6 oz (93.5 kg)   SpO2 96%   BMI 27.95 kg/m    Physical Exam Constitutional:      General: He is not in acute distress.    Appearance: Normal appearance.  HENT:     Head: Normocephalic.  Neck:     Vascular: No carotid bruit.  Cardiovascular:     Rate and Rhythm: Normal rate and regular rhythm.     Pulses: Normal pulses.     Heart sounds: Normal heart sounds.  Pulmonary:     Effort: Pulmonary effort is normal.     Breath sounds: Normal breath sounds.  Abdominal:     General: Bowel sounds are normal.     Palpations: Abdomen is soft.  Musculoskeletal:     Cervical back: Neck supple. No tenderness.     Right lower leg: No edema.     Left lower leg: No edema.     Comments: Knee exam w/o instability.  Neurological:     Mental Status: He is alert.      No results found for any visits on 11/13/24.    The ASCVD Risk score (Arnett DK, et al., 2019) failed to calculate for the following reasons:   The valid total cholesterol range is 130 to 320 mg/dL    Assessment & Plan:   Assessment & Plan Alcoholism [facility]) Recent relapse with severe withdrawal symptoms. Currently attending [facility] for support, receiving medications for withdrawal symptoms, anxiety, and sleep. Considering intensive outpatient program (IOP) and potential inpatient rehab if financially feasible. Acknowledges inability to quit alone and is compliant with [facility]'s program. - Continue attending [facility] for support and medication management. - Consider  intensive outpatient program (IOP) for additional support. - Discuss potential inpatient rehab options with [facility] if financially feasible.    Stage 3a chronic kidney disease (HCC) NSAID avoidance.  Monitor carefully and may eventually need Nephrology referral.    Alcoholic cirrhosis of liver without ascites (HCC) Chronic liver damage due to alcohol use. No current liver specialist due to financial constraints. Advised to avoid acetaminophen  due to liver compromise. - Referred to Dr. Nicolina for liver evaluation.  Orders:   Ambulatory referral to Gastroenterology   Comprehensive metabolic panel with GFR  Hypomagnesemia Encouraged a healthy diet. Orders:   Magnesium   Thrombocytopenia Low platelet count likely secondary to alcohol use. Expected to improve with continued abstinence. Monitoring required to assess platelet count trends. - Ordered blood work to monitor platelet count. -  Continue abstinence from alcohol to promote platelet recovery. Orders:   CBC with Differential/Platelet  Chronic pain of both knees Significant pain affecting mobility. Previous cortisone injections provided temporary relief. Referral to orthopedic specialist needed for further management. - Referred to Dr. Larnell at Orthopedics for evaluation and management of knee pain. - Prioritized referral for timely intervention. Orders:   Ambulatory referral to Orthopedic Surgery     No follow-ups on file.    REDDING PONCE NORLEEN FALCON., MD "

## 2024-11-13 NOTE — Assessment & Plan Note (Signed)
 Significant pain affecting mobility. Previous cortisone injections provided temporary relief. Referral to orthopedic specialist needed for further management. - Referred to Dr. Larnell at Orthopedics for evaluation and management of knee pain. - Prioritized referral for timely intervention. Orders:   Ambulatory referral to Orthopedic Surgery

## 2024-11-13 NOTE — Assessment & Plan Note (Signed)
 NSAID avoidance.  Monitor carefully and may eventually need Nephrology referral.

## 2024-11-14 LAB — COMPREHENSIVE METABOLIC PANEL WITH GFR
ALT: 59 IU/L — ABNORMAL HIGH (ref 0–44)
AST: 53 IU/L — ABNORMAL HIGH (ref 0–40)
Albumin: 4.5 g/dL (ref 3.9–4.9)
Alkaline Phosphatase: 117 IU/L (ref 47–123)
BUN/Creatinine Ratio: 14 (ref 10–24)
BUN: 21 mg/dL (ref 8–27)
Bilirubin Total: 0.9 mg/dL (ref 0.0–1.2)
CO2: 22 mmol/L (ref 20–29)
Calcium: 8.9 mg/dL (ref 8.6–10.2)
Chloride: 105 mmol/L (ref 96–106)
Creatinine, Ser: 1.51 mg/dL — ABNORMAL HIGH (ref 0.76–1.27)
Globulin, Total: 2.3 g/dL (ref 1.5–4.5)
Glucose: 80 mg/dL (ref 70–99)
Potassium: 4.7 mmol/L (ref 3.5–5.2)
Sodium: 141 mmol/L (ref 134–144)
Total Protein: 6.8 g/dL (ref 6.0–8.5)
eGFR: 52 mL/min/1.73 — ABNORMAL LOW

## 2024-11-14 LAB — CBC WITH DIFFERENTIAL/PLATELET
Basophils Absolute: 0.1 x10E3/uL (ref 0.0–0.2)
Basos: 1 %
EOS (ABSOLUTE): 0.1 x10E3/uL (ref 0.0–0.4)
Eos: 1 %
Hematocrit: 43 % (ref 37.5–51.0)
Hemoglobin: 14.3 g/dL (ref 13.0–17.7)
Immature Grans (Abs): 0 x10E3/uL (ref 0.0–0.1)
Immature Granulocytes: 0 %
Lymphocytes Absolute: 0.9 x10E3/uL (ref 0.7–3.1)
Lymphs: 21 %
MCH: 30.9 pg (ref 26.6–33.0)
MCHC: 33.3 g/dL (ref 31.5–35.7)
MCV: 93 fL (ref 79–97)
Monocytes Absolute: 0.4 x10E3/uL (ref 0.1–0.9)
Monocytes: 10 %
Neutrophils Absolute: 2.9 x10E3/uL (ref 1.4–7.0)
Neutrophils: 67 %
Platelets: 114 x10E3/uL — ABNORMAL LOW (ref 150–450)
RBC: 4.63 x10E6/uL (ref 4.14–5.80)
RDW: 14.6 % (ref 11.6–15.4)
WBC: 4.4 x10E3/uL (ref 3.4–10.8)

## 2024-11-14 LAB — MAGNESIUM: Magnesium: 2 mg/dL (ref 1.6–2.3)

## 2024-11-16 ENCOUNTER — Ambulatory Visit (HOSPITAL_BASED_OUTPATIENT_CLINIC_OR_DEPARTMENT_OTHER): Payer: Self-pay | Admitting: Family Medicine

## 2024-11-16 ENCOUNTER — Other Ambulatory Visit (HOSPITAL_BASED_OUTPATIENT_CLINIC_OR_DEPARTMENT_OTHER): Payer: Self-pay | Admitting: Family Medicine

## 2024-11-16 DIAGNOSIS — N1831 Chronic kidney disease, stage 3a: Secondary | ICD-10-CM

## 2024-11-27 ENCOUNTER — Other Ambulatory Visit (HOSPITAL_BASED_OUTPATIENT_CLINIC_OR_DEPARTMENT_OTHER): Payer: Self-pay | Admitting: Nurse Practitioner

## 2024-11-27 DIAGNOSIS — K7030 Alcoholic cirrhosis of liver without ascites: Secondary | ICD-10-CM

## 2024-11-27 DIAGNOSIS — R748 Abnormal levels of other serum enzymes: Secondary | ICD-10-CM

## 2024-11-27 DIAGNOSIS — K766 Portal hypertension: Secondary | ICD-10-CM

## 2024-12-03 ENCOUNTER — Ambulatory Visit: Payer: Self-pay

## 2024-12-03 ENCOUNTER — Ambulatory Visit (INDEPENDENT_AMBULATORY_CARE_PROVIDER_SITE_OTHER): Admitting: Student

## 2024-12-03 ENCOUNTER — Other Ambulatory Visit (HOSPITAL_BASED_OUTPATIENT_CLINIC_OR_DEPARTMENT_OTHER): Payer: Self-pay

## 2024-12-03 ENCOUNTER — Encounter (HOSPITAL_BASED_OUTPATIENT_CLINIC_OR_DEPARTMENT_OTHER): Payer: Self-pay | Admitting: Student

## 2024-12-03 VITALS — BP 151/102 | HR 66 | Temp 98.0°F | Resp 16 | Ht 72.0 in | Wt 208.6 lb

## 2024-12-03 DIAGNOSIS — I10 Essential (primary) hypertension: Secondary | ICD-10-CM

## 2024-12-03 DIAGNOSIS — G621 Alcoholic polyneuropathy: Secondary | ICD-10-CM | POA: Diagnosis not present

## 2024-12-03 DIAGNOSIS — M25562 Pain in left knee: Secondary | ICD-10-CM

## 2024-12-03 DIAGNOSIS — G8929 Other chronic pain: Secondary | ICD-10-CM

## 2024-12-03 DIAGNOSIS — M25561 Pain in right knee: Secondary | ICD-10-CM | POA: Diagnosis not present

## 2024-12-03 MED ORDER — CLONIDINE HCL 0.1 MG PO TABS
0.1000 mg | ORAL_TABLET | Freq: Two times a day (BID) | ORAL | 3 refills | Status: AC
  Filled 2024-12-03: qty 60, 30d supply, fill #0

## 2024-12-03 NOTE — Telephone Encounter (Signed)
 FYI Only or Action Required?: FYI only for provider: appointment scheduled on 1/15.  Patient was last seen in primary care on 11/13/2024 by Kenneth Norleen [name], MD.  Called Nurse Triage reporting Numbness and Extremity Weakness.  Symptoms began several days ago.  Interventions attempted: Other: discontinued medications on Saturday.  Symptoms are: gradually worsening.  Triage Disposition: See PCP When Office is Open (Within 3 Days)  Patient/caregiver understands and will follow disposition?: Yes Copied from CRM [id number]. Topic: Clinical - Red Word Triage >> Dec 03, 2024 11:55 AM Hadassah PARAS wrote: Red Word that prompted transfer to Nurse Triage: Pt had been experiencing alcohol withdrawl had trouble with liver and kindeys and began working w specialist. Hands have became lung and tinglings, knees have become weak and wobling, loosing control of arms and legs. Side effects have been included in some of the medication he was taken. He discontinued medication 4 days ago and is still experiencing symptoms. Transferred to NT Reason for Disposition  [1] Numbness or tingling on both sides of body AND [2] is a new symptom present > 24 hours  Answer Assessment - Initial Assessment Questions 1. SYMPTOM: What is the main symptom you are concerned about? (e.g., weakness, numbness)     Numbness and tingling to bilat hands and feet, and weakness to knees. They feel wobbly, like I can't keep them where they're supposed to be. I can walk but I have to be careful.   2. ONSET: When did this start? (e.g., minutes, hours, days; while sleeping)     Approx Saturday  3. LAST NORMAL: When was the last time you (the patient) were normal (no symptoms)?     Friday  4. PATTERN Does this come and go, or has it been constant since it started?  Is it present now?     Constant  5. CARDIAC SYMPTOMS: Have you had any of the following symptoms: chest pain, difficulty breathing, palpitations?      Denies  6. NEUROLOGIC SYMPTOMS: Have you had any of the following symptoms: headache, dizziness, vision loss, double vision, changes in speech, unsteady on your feet?     Unsteady on feet due to wobbly knees   Hx alcoholism, cirrhosis, kidney damage Acamprosate began 1 month ago Dc'd all medications Saturday  Protocols used: Neurologic Deficit-A-AH

## 2024-12-03 NOTE — Progress Notes (Addendum)
 "  Acute Office Visit  Subjective:     Patient ID: Kenneth Trevino, male    DOB: [birth year], 64 y.o.   MRN: [id number]  Chief Complaint  Patient presents with   Tingling    Pt had been experiencing alcohol withdrawl had trouble with liver and kindeys and began working w specialist. Hands have became lung and tinglings, knees have become weak and wobling, loosing control of arms and legs. Can hardly stand up.  Side effects have been included in some of the medication he was taken. He discontinued all medication 4 days ago and is still experiencing symptoms.  Last drink he had was Saturday.     HPI  Discussed the use of AI scribe software for clinical note transcription with the patient, who gave verbal consent to proceed.  History of Present Illness   Kenneth Trevino is a 64 year old male with alcohol use disorder who presents with numbness and tingling in his hands and feet.  He has been experiencing numbness and tingling in his hands and feet since November 28, 2024. The symptoms began in his left hand, gradually worsening and spreading to both hands and recently to his feet. He describes the sensation as feeling like he is 'walking on two sticks' and notes that his knees feel weak and wobbly, which is a new sensation compared to his usual knee pain. No chest pain, shortness of breath, or recent illness.  He has a history of alcohol use disorder and has been in the emergency room several times for withdrawal symptoms, including tremors and hallucinations. However, he notes that the current symptoms are different from his previous withdrawal experiences. His last drink was on November 28, 2024, and he stopped all medications around November 29, 2024. He has been working with an outpatient program at [facility] for alcohol use disorder.  He was previously taking several medications, including amlodipine  with valsartan for blood pressure, potassium supplements for low potassium levels, and vitamin  B1 (thiamine ). He stopped all medications recently, including the thiamine , which he had been taking as part of his treatment regimen. He also mentions taking sucralfate in the past, which he has since run out of.  His social history includes a long history of alcohol use, with efforts to quit since November 2024, although he has had some relapses. He mentions drinking tea instead of water and acknowledges not eating a lot of fruits and vegetables, which may impact his nutritional status. He has a history of knee problems, for which he has received cortisone and gel shots, and he uses a cane or walker for balance.      ROS Per HPI     Objective:    BP (!) 151/102   Pulse 66   Temp 98 F (36.7 C) (Oral)   Resp 16   Ht 6' (1.829 m)   Wt 208 lb 9.6 oz (94.6 kg)   SpO2 96%   BMI 28.29 kg/m  BP Readings from Last 3 Encounters:  12/03/24 (!) 151/102  11/13/24 138/86  10/09/24 (!) 151/79   Wt Readings from Last 3 Encounters:  12/03/24 208 lb 9.6 oz (94.6 kg)  11/13/24 206 lb 1.6 oz (93.5 kg)  10/09/24 212 lb (96.2 kg)   SpO2 Readings from Last 3 Encounters:  12/03/24 96%  11/13/24 96%  10/09/24 98%      Physical Exam Constitutional:      General: He is not in acute distress.    Appearance: Normal appearance. He is not  ill-appearing.  HENT:     Head: Normocephalic and atraumatic.     Right Ear: External ear normal.     Left Ear: External ear normal.     Nose: Nose normal.  Eyes:     Conjunctiva/sclera: Conjunctivae normal.  Cardiovascular:     Rate and Rhythm: Normal rate and regular rhythm.     Pulses: Normal pulses.     Heart sounds: Normal heart sounds. No murmur heard.    No friction rub.  Pulmonary:     Effort: Pulmonary effort is normal. No respiratory distress.     Breath sounds: Normal breath sounds. No wheezing, rhonchi or rales.  Skin:    General: Skin is warm and dry.     Coloration: Skin is not jaundiced or pale.  Neurological:     Mental Status: He is  alert.     Comments: Mental Status:  Alert, oriented, thought content appropriate. Speech fluent without evidence of aphasia. Able to follow 2 step commands without difficulty.   Cranial Nerves:  II:  Peripheral visual fields grossly normal, pupils equal, round, reactive to light III,IV, VI: ptosis not present, extra-ocular motions intact bilaterally  V,VII: smile symmetric, facial light touch sensation equal VIII: hearing grossly normal bilaterally  IX,X: midline uvula rise  XI: bilateral shoulder shrug equal and strong XII: midline tongue extension   Motor:  5/5 in upper and lower extremities bilaterally including strong and equal grip strength and dorsiflexion/plantar flexion Sensory:light touch normal in all extremities.  Deep Tendon Reflexes: 2+ and symmetric  Cerebellar: no tremor noted Gait: gait is slightly wide based and wobbly but romberg negative.  CV: distal pulses palpable throughout      Psychiatric:        Mood and Affect: Mood normal.        Behavior: Behavior normal.     No results found for any visits on 12/03/24.      Assessment & Plan:   Assessment and Plan    Alcoholic peripheral neuropathy Numbness and tingling in hands and feet, onset Saturday (sometime after relapsing and having a drink), likely due to alcohol withdrawal with acute exacerbation of neuropathy. He did already have a dx of this in his chart but he was unaware. neuro exam was benign today aside from gait which is already atypical and wobbly at baseline. Symptoms may be exacerbated by thiamine  deficiency and alcohol withdrawal. Neuropathy may not resolve completely and could be related to long-term alcohol use. No recent sickness or ascending neuropathy to suggest GB.  - Restart thiamine  supplementation - Checked thiamine  levels, b6, b12, and folate - ER precautions strictly advised and pt agreed  Essential hypertension Blood pressure management interrupted due to medication cessation.  Importance of resuming blood pressure medications emphasized. - Restart blood pressure medications including amlodipine  with valsartan and clonidine   Hx of thiamine  def? Potential thiamine  deficiency contributing to neuropathy symptoms. Importance of thiamine  supplementation discussed. - Restart thiamine  supplementation - Checked thiamine  levels  Chronic Knee Pain Chronic knee pain and instability, possibly exacerbated by neuropathy and subsequent balance issues. Previous treatments include cortisone and gel shots. Upcoming appointment for knee evaluation and potential replacement discussed. - Continue with scheduled knee evaluation and potential replacement - Use cane or walker for stability     Other Stopped all meds after onset. Urged him to restart.  Orders Placed This Encounter  Procedures   B12 and Folate Panel   Vitamin B1   CBC with Differential/Platelet   Comprehensive metabolic panel  with GFR   Vitamin B6   I personally spent a total of 71 minutes in the care of the patient today including preparing to see the patient, getting/reviewing separately obtained history, performing a medically appropriate exam/evaluation, counseling and educating, placing orders, and documenting clinical information in the EHR.   Return if symptoms worsen or fail to improve.  Nolan Tuazon T Jiles Goya, PA-C  "

## 2024-12-03 NOTE — Patient Instructions (Addendum)
 It was nice to see you today!  Alcoholic peripheral neuropathy  Any fever, chills, shortness of breath, paralysis, or symptoms that are worsening or concerning to you and you should please head to the ER.  If you have any problems before your next visit feel free to message me via MyChart (minor issues or questions) or call the office, otherwise you may reach out to schedule an office visit.  Thank you! Izack Hoogland, PA-C

## 2024-12-08 ENCOUNTER — Ambulatory Visit (HOSPITAL_BASED_OUTPATIENT_CLINIC_OR_DEPARTMENT_OTHER): Payer: Self-pay | Admitting: Student

## 2024-12-08 LAB — CBC WITH DIFFERENTIAL/PLATELET
Basophils Absolute: 0 x10E3/uL (ref 0.0–0.2)
Basos: 1 %
EOS (ABSOLUTE): 0.1 x10E3/uL (ref 0.0–0.4)
Eos: 2 %
Hematocrit: 45.8 % (ref 37.5–51.0)
Hemoglobin: 15.4 g/dL (ref 13.0–17.7)
Immature Grans (Abs): 0 x10E3/uL (ref 0.0–0.1)
Immature Granulocytes: 0 %
Lymphocytes Absolute: 1 x10E3/uL (ref 0.7–3.1)
Lymphs: 19 %
MCH: 31.3 pg (ref 26.6–33.0)
MCHC: 33.6 g/dL (ref 31.5–35.7)
MCV: 93 fL (ref 79–97)
Monocytes Absolute: 0.4 x10E3/uL (ref 0.1–0.9)
Monocytes: 8 %
Neutrophils Absolute: 3.7 x10E3/uL (ref 1.4–7.0)
Neutrophils: 70 %
Platelets: 78 x10E3/uL — CL (ref 150–450)
RBC: 4.92 x10E6/uL (ref 4.14–5.80)
RDW: 15.9 % — ABNORMAL HIGH (ref 11.6–15.4)
WBC: 5.2 x10E3/uL (ref 3.4–10.8)

## 2024-12-08 LAB — COMPREHENSIVE METABOLIC PANEL WITH GFR
ALT: 65 IU/L — ABNORMAL HIGH (ref 0–44)
AST: 78 IU/L — ABNORMAL HIGH (ref 0–40)
Albumin: 4.5 g/dL (ref 3.9–4.9)
Alkaline Phosphatase: 110 IU/L (ref 47–123)
BUN/Creatinine Ratio: 11 (ref 10–24)
BUN: 16 mg/dL (ref 8–27)
Bilirubin Total: 1.2 mg/dL (ref 0.0–1.2)
CO2: 22 mmol/L (ref 20–29)
Calcium: 9.2 mg/dL (ref 8.6–10.2)
Chloride: 104 mmol/L (ref 96–106)
Creatinine, Ser: 1.48 mg/dL — ABNORMAL HIGH (ref 0.76–1.27)
Globulin, Total: 2.7 g/dL (ref 1.5–4.5)
Glucose: 97 mg/dL (ref 70–99)
Potassium: 4.2 mmol/L (ref 3.5–5.2)
Sodium: 142 mmol/L (ref 134–144)
Total Protein: 7.2 g/dL (ref 6.0–8.5)
eGFR: 53 mL/min/1.73 — ABNORMAL LOW

## 2024-12-08 LAB — VITAMIN B6: Vitamin B6: 5.6 ug/L (ref 3.4–65.2)

## 2024-12-08 LAB — B12 AND FOLATE PANEL
Folate: 16.3 ng/mL
Vitamin B-12: 487 pg/mL (ref 232–1245)

## 2024-12-08 LAB — VITAMIN B1: Thiamine: 162.6 nmol/L (ref 66.5–200.0)

## 2024-12-10 ENCOUNTER — Encounter (HOSPITAL_BASED_OUTPATIENT_CLINIC_OR_DEPARTMENT_OTHER): Payer: Self-pay

## 2024-12-14 ENCOUNTER — Telehealth: Payer: Self-pay

## 2024-12-14 ENCOUNTER — Ambulatory Visit (HOSPITAL_BASED_OUTPATIENT_CLINIC_OR_DEPARTMENT_OTHER): Admitting: Family Medicine

## 2024-12-14 NOTE — Transitions of Care (Post Inpatient/ED Visit) (Signed)
 Today's TOC FU Call Status: Today's TOC FU Call Status:: Successful TOC FU Call Completed TOC FU Call Complete Date: 12/14/24  Patient's Name and Date of Birth confirmed. Name, DOB  Transition Care Management Follow-up Telephone Call Date of Discharge: 12/11/24 Discharge Facility: Other ([facility]) Name of Other ([facility]) Discharge Facility: [facility] Type of Discharge: Inpatient Admission Primary Inpatient Discharge Diagnosis:: ETOH Withdraw How have you been since you were released from the hospital?: Same Any questions or concerns?: Yes Patient Questions/Concerns:: Concerns about vitamin needs. Patient Questions/Concerns Addressed: Other:  Items Reviewed: Did you receive and understand the discharge instructions provided?: Yes Medications obtained,verified, and reconciled?: Yes (Medications Reviewed) Any new allergies since your discharge?: No Dietary orders reviewed?: NA Do you have support at home?: Yes People in Home [RPT]: alone  Medications Reviewed Today: Medications Reviewed Today     Reviewed by Carolee Heron NOVAK, RN (Case Manager) on 12/14/24 at 1626  Med List Status: <None>   Medication Order Taking? Sig Documenting Provider Last Dose Status Informant  acamprosate (CAMPRAL) 333 MG tablet [id number] Yes Take 666 mg by mouth 3 (three) times daily. Does not take when using. [provider]  Active   albuterol  (VENTOLIN  HFA) 108 (90 Base) MCG/ACT inhaler [id number] Yes Inhale 2 puffs into the lungs every 4 (four) hours as needed for wheezing or shortness of breath. [provider]  Active   Allopurinol 200 MG TABS [id number] Yes allopurinol 1 PO QD for gout [provider]  Active   amLODipine -valsartan (EXFORGE) 10-160 MG tablet [id number] Yes amlodipine  and Valsartan 5mg /160mg  1 PO QD bedtime [provider]  Active            Med Note SYDELL, BING NOVAK Kitchens Dec 14, 2024  4:23 PM) Noted with patient this was not on  discharge medications list, but losartan potassium 25 mg daily was and he had both medication on hand.   chlordiazePOXIDE (LIBRIUM) 25 MG capsule [id number]  Take by mouth.  Patient not taking: Reported on 12/14/2024   [provider]  Active   cloNIDine  (CATAPRES ) 0.1 MG tablet [id number] Yes Take 1 tablet (0.1 mg total) by mouth 2 (two) times daily. Rothfuss, Jacob T, PA-C  Active   colchicine  0.6 MG tablet [id number] Yes Take by mouth. [provider]  Active   folic acid  (FOLVITE ) 1 MG tablet [id number] Yes Take 1 mg by mouth daily. [provider]  Active   losartan (COZAAR) 25 MG tablet [id number] Yes Take 25 mg by mouth daily. [provider]  Active            Med Note SYDELL, BING NOVAK Kitchens Dec 14, 2024  4:26 PM) Reviewed medication list with patient and discussed this was on discharge medications instead of the amlodipine  he had been taking. Discussed importance of follow up with PCP for post discharge medication review as well and to call regarding these two medication he had on hand and to take blood pressure daily.   naltrexone (DEPADE) 50 MG tablet [id number] Yes Take 50 mg by mouth at bedtime.  Patient taking differently: Take 50 mg by mouth at bedtime. Does not take when using, but reports he does not feel it helps not to drink.   [provider]  Active   potassium chloride  (KLOR-CON ) 20 MEQ packet [id number] Yes daily. as directed [provider]  Active   sucralfate (CARAFATE) 1 GM/10ML suspension [id number] Yes TAKE 1 GM ( ) BY MOUTH BEFORE MEALS  AS NEEDED AT BEDTIME MAY SUBSTITUTE FOR TABLETS IF CHEAPER [provider]  Active   thiamine  (VITAMIN B1) 100 MG tablet [id number] Yes TAKE 1 TABLET BY MOUTH EVERY DAY AT 12 PM NOON [provider]  Active             Home Care and Equipment/Supplies: Were Home Health Services Ordered?: No Any new equipment or medical supplies ordered?: No  Functional  Questionnaire: Do you need assistance with bathing/showering or dressing?: No Do you need assistance with meal preparation?: No Do you need assistance with eating?: No Do you have difficulty maintaining continence: No Do you need assistance with getting out of bed/getting out of a chair/moving?: No Do you have difficulty managing or taking your medications?: No  Follow up appointments reviewed: PCP Follow-up appointment confirmed?: No (Patient will call to make HFU when feels able to drive.) MD Provider Line [facility]lf[facility]m:[phone] Given: No Specialist Hospital Follow-up appointment confirmed?: NA Do you need transportation to your follow-up appointment?: No Do you understand care options if your condition(s) worsen?: Yes-patient verbalized understanding  SDOH Interventions Today    Flowsheet Row Most Recent Value  SDOH Interventions   Food Insecurity Interventions Intervention Not Indicated  Housing Interventions Intervention Not Indicated  Transportation Interventions Intervention Not Indicated, Payor Benefit  Utilities Interventions Intervention Not Indicated  Depression Interventions/Treatment  Medication, Counseling, Currently on Treatment  [patient currently seeing SA counselor at [facility] and sees a psychiatrist or prescribing provider as well.]  Health Literacy Interventions Intervention Not Indicated    Goals Addressed             This Visit's Progress    [facility] Transitions of Care (TOC) Care Plan       Problems:  Recent Hospitalization for treatment of HTN and ETOH withdraw, thrombocytopenia, pancytopenia Knowledge Deficit Related to discharge diagnosis (plural), Limited social support: No one to talk to about issues, concerns, No Hospital Follow Up Provider appointment but patient will call to make hospital follow up after rationale discussed and after he feels up to driving, and ETOH Alcohol withdraw needs and resources.   Goal:  Over the next 30 days, the patient will  not experience hospital readmission  Interventions:   Transitions of Care: DC Summary reviewed with patient including medications, discharge instructions, what to call provider for, follow up appointments, need for PCP HFU.  Health Screening education provided Discussed nutrition/approved vitamin supplementation for ETOH withdraw/nutrition support.  Fall prevention reviewed after recent falls noted.  Doctor Visits  - discussed the importance of doctor visits Post discharge activity limitations prescribed by provider reviewed Risk of withdrawal symptoms reviewed:  Combining alcohol with prescribed medications, especially naltrexone, acamprosate, disulfiram, or other CNS-active meds. Risk of overdose if using alcohol with opioids or sedatives Medication review and note of two similar medications patient had on hand and to check with PCP for HFU and clarification on which he should take (blood pressure medications), and to check blood pressure daily before and after taking medications with routing/notifying PCP of enrollment, need for HFU, and need for medication clarification on two similar blood pressure medications, one not on discharge medication review with patient.   Health Maintenance Interventions: Patient interviewed about adult health maintenance status including  Depression screen    Falls risk assessment    Pneumonia Vaccine Regular eye checkups Blood Pressure    Advised to discuss  Depression screen    Falls risk assessment    Pneumonia Vaccine Blood Pressure    Discuss care gaps,  nutritional, vitamins needs, and any alcohol abstaining resources with primary care provider   Anemia/Bleeding Interventions:  Assessment of understanding of anemia/bleeding disorder diagnosis  Basic overview and discussion of anemia/bleeding disorder or acute disease state  Medications reviewed  Counseled on bleeding risk associated with pancytopenia and importance of self-monitoring for  signs/symptoms of bleeding Counseled on avoidance of NSAIDs due to increased bleeding risk Provided education about signs and symptoms of active bleeding such as stomach discomfort, coughing up blood or blood tinged secretions, bleeding from the gums/teeth, nosebleeds, increased bruising, blood in the urine/stool and/or if a traumatic injury occurs, regardless of severity of injury  Advised to call provider or 911 if active bleeding or signs and symptoms of active bleeding occur recommended promotion of rest and energy-conserving measures to manage fatigue, such as balancing activity with periods of rest encouraged strategies to prevent falls related to fatigue, weakness and dizziness; encouraged sitting before standing and using an assistive device encouraged optimal oral intake to support fluid balance and nutrition encouraged dietary changes to increase dietary intake of iron, Vitamin B12 and folic acid  as advised/prescribed Screening for signs and symptoms of depression related to chronic disease state Assessed social determinant of health barriers  Lab Results  Component Value Date   WBC 5.2 12/03/2024   HGB 15.4 12/03/2024   HCT 45.8 12/03/2024   MCV 93 12/03/2024   PLT 78 (LL) 12/03/2024   Falls Interventions: Provided written and verbal education re: potential causes of falls and Fall prevention strategies Reviewed medications and discussed potential side effects of medications such as dizziness and frequent urination Advised patient of importance of notifying provider of falls Assessed for falls since last encounter Assessed patients knowledge of fall risk prevention secondary to previously provided education Screening for signs and symptoms of depression related to chronic disease state  Assessed social determinant of health barriers  Patient Self Care Activities:   Attend all scheduled provider appointments Abstain from alcohol use taking one day at a time as success.   Follow discharge instructions reviewed from DC Summary on this call.  Report falls to provider, document blood pressures daily.  Fall preventatives:  Use cane as needed when feeling weak or unstable.  Note when feeling the tingling in fingers and toes reported on this call and discuss with PCP HFU.  Note weakness associated with blood pressure or other symptoms, medications, ETOH use.  Continue seeing therapist at [facility] for alcohol substance issues/withdraw/inpatient treatment possibilities if desired.  Keep a log of triggers, cravings, use and associated issues, emotions, medications, habits or other associated issues to try to track when most likely to use or able to abstain from using ETOH.  Triggers: (stress, isolation, social settings). What are your coping strategies: (calling supports, distraction skills, grounding techniques). Safe people to contact when urges arise.  Keep a list with phone/contact information.  What to do if drinking escalates: Make a plan for yourself and others you want to assist you.  Call pharmacy for medication refills 3-7 days in advance of running out of medications Call provider office for new concerns or questions; Clarification of two similar blood pressure medications.  Notify RN Care Manager of TOC call rescheduling needs Participate in Transition of Care Program/Attend TOC scheduled calls Perform all self care activities independently  Perform IADL's (shopping, preparing meals, housekeeping, managing finances) independently Take medications as prescribed   call the [place]  National Suicide Prevention Lifeline: 959-820-8510 or TTY: (820) 769-1505 TTY 575-428-5246) to talk to a trained counselor call 1-800-273-TALK (  toll free, 24 hour hotline) if experiencing a Mental Health or Behavioral Health Crisis check blood pressure daily choose a place to take my blood pressure (home, clinic or office, retail store) write blood pressure results in a log or  diary keep a blood pressure log take blood pressure log to all doctor appointments call doctor for signs and symptoms of high or low blood pressure; know signs of low blood pressure such as lightheadedness, dizziness, falls, fatigue, foggy thinking.  keep all doctor appointments take medications for blood pressure exactly as prescribed report new symptoms to your doctor eat more whole grains, fruits and vegetables, lean meats and healthy fats Stay hydrated with no sugary drinks, water is best.  Take multivitamin and B complex after discussing with PCP.  Note that B12 may need to be sublingual or under the tongue to bypass the stomach absorption issues or ask PCP about B12 injections if labwork shows you are low or prescribed by provider.   Plan:  An initial telephone outreach has been scheduled for: 12/23/2024 around 3 pm.  The patient will call PCP as advised to schedule post discharge hospital follow up or HFU and to clarify vitamins to take and two similar medications patient has on hand; one on discharge list and one that patient was taking prior to admission.  Continue seeking counseling at [facility], consider 30 day inpatient treatment discussed as recommended by PCP after PCP HFU and feeling better.  Keep a log of triggers, cravings, use and associated issues, emotions, medications, habits or other associated issues to try to track when most likely to use or able to abstain from using ETOH.  RN CM will route this note to PCP with high priority upon closing.    Route    Summary of post discharge outreach and program enrollment call with patient.  TOC follow-up call completed with the patient for the information on this call.  Patient verbally agreed to follow up calls/[facility] program; An initial telephone outreach has been scheduled for: 12/23/2024 around 3 pm.   Patient/family reached and identity verified. Patient/family or informant for call reports clinical status since discharge  as stable though reports not feeling much better,  Denies unplanned ED visits, hospital readmission, or falls since discharge.  Concerns reviewed during call. Issues previously addressed by inpatient or outpatient providers were acknowledged and verified with patient/family.  Care management needs identified at this time identified via medication review and this note will be routed to PCP office with high priority and patient also to follow up with PCP for clarification on two similar medications noted during review. Patient needs to schedule HFU for as soon as he is able to drive to appointment.   Medication list reviewed in full. Patient confirms adherence to prescribed regimen except as otherwise documented. Focused review completed on new, changed, and high-risk medications as applicable.  Patient/family/informant for call denies access barriers, but need clarification on two similar blood pressure medications.  Patient to start taking blood pressure daily, logging to take to providers, and to note any issues, dizziness, high or low blood pressures.   Follow-up appointments reviewed. Patient/family or informant on this call is aware of recommended follow-up plan and reports ability to attend.  No transportation or scheduling barriers identified unless otherwise noted.  Self-care instructions and diagnosis-specific red flags reviewed and documented if any assessed or noted.  Patient/family/informant verbalized understanding via teach-back and agrees to plan of care, self care activities discussed, follow up appointments needed, medication  clarification needed.   Patient/family/informant has TOC RN CM contact number in case of questions but not for Urgent or Emergency care needs.  Patient/family/informant noted on this call was advised to contact PCP or seek urgent/emergency 911 care if symptoms worsen.    The patient has been provided with contact information for the care management team and  has been advised to call with any no urgent or non emergency health-related questions or concerns.  The patient verbalized understanding with current POC and follow up actions needed by patient.   The patient is directed to their insurance card regarding availability of benefits coverage    An initial telephone outreach has been scheduled for: 12/23/2024 around 3 pm.   Bing Edison MSN, RN RN Case Manager [place]  [facility]Population Health Office Hours M-F [phone] Direct Dial: [phone] Main Phone [phone]  Fax: [phone] [email or web address]

## 2024-12-14 NOTE — Patient Instructions (Signed)
 Visit Information  Thank you for taking time to visit with me today. Please don't hesitate to contact me if I can be of assistance to you before our next scheduled telephone appointment.  Please call your PCP provider to schedule post discharge hospital follow up per our discussion and also call asap to clarify two similar blood pressure medications: one that is on discharge list, and one you were taking prior to admission, Take you blood pressure daily and report any symptoms such as dizziness, lightheadedness, and review fall prevention. Take care and next schedule call is below:   An initial telephone outreach has been scheduled for: 12/23/2024 around 3 pm.   Following is a copy of your care plan:   Goals Addressed             This Visit's Progress    VBCI Transitions of Care (TOC) Care Plan       Problems:  Recent Hospitalization for treatment of HTN and ETOH withdraw, thrombocytopenia, pancytopenia Knowledge Deficit Related to discharge diagnosis (plural), Limited social support: No one to talk to about issues, concerns, No Hospital Follow Up Provider appointment but patient will call to make hospital follow up after rationale discussed and after he feels up to driving, and ETOH Alcohol withdraw needs and resources.   Goal:  Over the next 30 days, the patient will not experience hospital readmission  Interventions:   Transitions of Care: DC Summary reviewed with patient including medications, discharge instructions, what to call provider for, follow up appointments, need for PCP HFU.  Health Screening education provided Doctor Visits  - discussed the importance of doctor visits Post discharge activity limitations prescribed by provider reviewed Risk of withdrawal symptoms reviewed:  Combining alcohol with prescribed medications, especially naltrexone, acamprosate, disulfiram, or other CNS-active meds. Risk of overdose if using alcohol with opioids or sedatives  Health  Maintenance Interventions: Patient interviewed about adult health maintenance status including  Depression screen    Falls risk assessment    Pneumonia Vaccine Regular eye checkups Blood Pressure    Advised to discuss  Depression screen    Falls risk assessment    Pneumonia Vaccine Blood Pressure    Discuss care gaps, nutritional, vitamins needs, and any alcohol abstaining resources with primary care provider    Anemia/Bleeding Interventions:  Assessment of understanding of anemia/bleeding disorder diagnosis  Basic overview and discussion of anemia/bleeding disorder or acute disease state  Medications reviewed  Counseled on bleeding risk associated with pancytopenia and importance of self-monitoring for signs/symptoms of bleeding Counseled on avoidance of NSAIDs due to increased bleeding risk Provided education about signs and symptoms of active bleeding such as stomach discomfort, coughing up blood or blood tinged secretions, bleeding from the gums/teeth, nosebleeds, increased bruising, blood in the urine/stool and/or if a traumatic injury occurs, regardless of severity of injury  Advised to call provider or 911 if active bleeding or signs and symptoms of active bleeding occur recommended promotion of rest and energy-conserving measures to manage fatigue, such as balancing activity with periods of rest encouraged strategies to prevent falls related to fatigue, weakness and dizziness; encouraged sitting before standing and using an assistive device encouraged optimal oral intake to support fluid balance and nutrition encouraged dietary changes to increase dietary intake of iron, Vitamin B12 and folic acid  as advised/prescribed Screening for signs and symptoms of depression related to chronic disease state Assessed social determinant of health barriers  Lab Results  Component Value Date   WBC 5.2 12/03/2024   HGB  15.4 12/03/2024   HCT 45.8 12/03/2024   MCV 93 12/03/2024   PLT 78 (LL)  12/03/2024   Falls Interventions: Provided written and verbal education re: potential causes of falls and Fall prevention strategies Reviewed medications and discussed potential side effects of medications such as dizziness and frequent urination Advised patient of importance of notifying provider of falls Assessed for falls since last encounter Assessed patients knowledge of fall risk prevention secondary to previously provided education Screening for signs and symptoms of depression related to chronic disease state  Assessed social determinant of health barriers  Patient Self Care Activities:   Attend all scheduled provider appointments Abstain from alcohol use taking one day at a time as success.  Follow discharge instructions reviewed from DC Summary on this call.  Report falls to provider, document blood pressures daily.  Fall preventatives:  Use cane as needed when feeling weak or unstable.  Note when feeling the tingling in fingers and toes reported on this call and discuss with PCP HFU.  Note weakness associated with blood pressure or other symptoms, medications, ETOH use.  Continue seeing therapist at [facility] for alcohol substance issues/withdraw/inpatient treatment possibilities if desired.  Keep a log of triggers, cravings, use and associated issues, emotions, medications, habits or other associated issues to try to track when most likely to use or able to abstain from using ETOH.  Triggers: (stress, isolation, social settings). What are your coping strategies: (calling supports, distraction skills, grounding techniques). Safe people to contact when urges arise.  Keep a list with phone/contact information.  What to do if drinking escalates: Make a plan for yourself and others you want to assist you.  Call pharmacy for medication refills 3-7 days in advance of running out of medications Call provider office for new concerns or questions  Notify RN Care Manager of TOC call  rescheduling needs Participate in Transition of Care Program/Attend TOC scheduled calls Perform all self care activities independently  Perform IADL's (shopping, preparing meals, housekeeping, managing finances) independently Take medications as prescribed   call the [place]  National Suicide Prevention Lifeline: 402-398-4545 or TTY: 607-547-6890 TTY 801-061-4423) to talk to a trained counselor call 1-800-273-TALK (toll free, 24 hour hotline) if experiencing a Mental Health or Behavioral Health Crisis check blood pressure daily choose a place to take my blood pressure (home, clinic or office, retail store) write blood pressure results in a log or diary keep a blood pressure log take blood pressure log to all doctor appointments call doctor for signs and symptoms of high or low blood pressure; know signs of low blood pressure such as lightheadedness, dizziness, falls, fatigue, foggy thinking.  keep all doctor appointments take medications for blood pressure exactly as prescribed report new symptoms to your doctor eat more whole grains, fruits and vegetables, lean meats and healthy fats Stay hydrated with no sugary drinks, water is best.  Take multivitamin and B complex after discussing with PCP.  Note that B12 may need to be sublingual or under the tongue to bypass the stomach absorption issues or ask PCP about B12 injections if labwork shows you are low or prescribed by provider.   Plan:  An initial telephone outreach has been scheduled for: 12/23/2024 around 3 pm.  The patient will call PCP as advised to schedule post discharge hospital follow up or HFU and to clarify blood pressure medications.   Continue seeking counseling at [facility], consider 30 day inpatient treatment discussed as recommended by PCP after PCP HFU and feeling better.  Keep  a log of triggers, cravings, use and associated issues, emotions, medications, habits or other associated issues to try to track when most likely to  use or able to abstain from using ETOH.         Patient verbalizes understanding of instructions and care plan provided today and agrees to view in MyChart. Active MyChart status and patient understanding of how to access instructions and care plan via MyChart confirmed with patient.     Telephone follow up appointment with care management team member scheduled for: An initial telephone outreach has been scheduled for: 12/23/2024 around 3 pm.   Please call the care guide team at [phone] if you need to cancel or reschedule your appointment.   Please call the [place]  National Suicide Prevention Lifeline: (313)271-6914 or TTY: 971-203-4738 TTY 431-357-5610) to talk to a trained counselor call 1-800-273-TALK (toll free, 24 hour hotline) if you are experiencing a Mental Health or Behavioral Health Crisis or need someone to talk to.   Bing Edison MSN, RN RN [employer]  [facility]BC[facility]-Population Health Office Hours M-F [phone] Direct Dial: [phone] Main Phone [phone]  Fax: [phone] [email or web address]

## 2024-12-15 ENCOUNTER — Other Ambulatory Visit (HOSPITAL_BASED_OUTPATIENT_CLINIC_OR_DEPARTMENT_OTHER): Payer: Self-pay | Admitting: Family Medicine

## 2024-12-15 ENCOUNTER — Other Ambulatory Visit (HOSPITAL_BASED_OUTPATIENT_CLINIC_OR_DEPARTMENT_OTHER): Payer: Self-pay

## 2024-12-15 ENCOUNTER — Inpatient Hospital Stay (HOSPITAL_BASED_OUTPATIENT_CLINIC_OR_DEPARTMENT_OTHER): Admission: RE | Admit: 2024-12-15 | Source: Ambulatory Visit | Admitting: Radiology

## 2024-12-15 ENCOUNTER — Ambulatory Visit (HOSPITAL_BASED_OUTPATIENT_CLINIC_OR_DEPARTMENT_OTHER): Payer: Self-pay | Admitting: Family Medicine

## 2024-12-15 DIAGNOSIS — M10062 Idiopathic gout, left knee: Secondary | ICD-10-CM

## 2024-12-15 DIAGNOSIS — M109 Gout, unspecified: Secondary | ICD-10-CM

## 2024-12-15 MED ORDER — COLCHICINE 0.6 MG PO TABS
0.6000 mg | ORAL_TABLET | Freq: Two times a day (BID) | ORAL | 0 refills | Status: AC
  Filled 2024-12-15: qty 60, 30d supply, fill #0

## 2024-12-15 NOTE — Telephone Encounter (Signed)
 FYI Only or Action Required?: Action required by provider: medication refill request.  Patient was last seen in primary care on 12/03/2024 by Rothfuss, Lang DASEN, PA-C.  Called Nurse Triage reporting Gout, Foot Pain, Knee Pain, and Hand Pain.  Symptoms began several days ago.  Interventions attempted: OTC medications:  Tylenol  and Prescription medications: Allopurinol.  Symptoms are: gradually worsening.  Triage Disposition: See Physician Within 24 Hours  Patient/caregiver understands and will follow disposition?: No, wishes to speak with PCP                    Message from Darshell M sent at 12/15/2024  9:20 AM EST  Summary: Worsening gout symptoms   Reason for Triage: Gout flare up. Pain level 5 laying down but when walking 10, 11, 12. Symptoms worse today. Patient requesting colchicine  0.6 MG tablet but reporting worsening.         Reason for Disposition  [1] Swollen foot AND [2] no fever  (Exceptions: Localized bump from bunions, calluses, insect bite, sting.)  Answer Assessment - Initial Assessment Questions If he gets a gout flare up he usually stops his allopurinol and  takes 2 colchicine  followed by 1 an hour later it usually resolves.  1. ONSET: When did the pain start?      Few days, worsened yesterday.  2. LOCATION: Where is the pain located?      Both feet/toes and top of both feet.  3. PAIN: How bad is the pain?    (Scale 1-10; or mild, moderate, severe)    No pain at rest, moving feet it is a 5/10 and walking or standing is over 10/10.  4. WORK OR EXERCISE: Has there been any recent work or exercise that involved this part of the body?      No.  5. CAUSE: What do you think is causing the foot pain?     Gout.  6. OTHER SYMPTOMS: Do you have any other symptoms? (e.g., leg pain, rash, fever, numbness)     Pain in both knees, both hands (knuckles). No fever, skin discoloration, redness, swelling, rash.  Protocols used: Foot  Pain-A-AH

## 2024-12-16 ENCOUNTER — Telehealth (HOSPITAL_BASED_OUTPATIENT_CLINIC_OR_DEPARTMENT_OTHER): Payer: Self-pay | Admitting: Family Medicine

## 2024-12-16 ENCOUNTER — Other Ambulatory Visit (HOSPITAL_BASED_OUTPATIENT_CLINIC_OR_DEPARTMENT_OTHER): Payer: Self-pay

## 2024-12-16 NOTE — Telephone Encounter (Signed)
 Copied from CRM [id number]. Topic: Clinical - Prescription Issue >> Dec 16, 2024  1:07 PM Laymon HERO wrote: Reason for CRM: Patient needing to have refill of  colchicine  sent to [facility] in [place]- that is closer to his home for someone to pick it up for him

## 2024-12-17 ENCOUNTER — Other Ambulatory Visit (HOSPITAL_BASED_OUTPATIENT_CLINIC_OR_DEPARTMENT_OTHER): Payer: Self-pay

## 2024-12-18 ENCOUNTER — Ambulatory Visit: Payer: Self-pay

## 2024-12-18 NOTE — Telephone Encounter (Signed)
 I informed pt PCP was booked for today. He declines UC. He said he cannot get up. He is requesting pain med recommendation He refuses to go to ED through an ambulance. Said he took tylenol . Was told not to take more than 2000mg . He said he was also told not to take ibuprofen but he is. He said he has to. Please advise.

## 2024-12-18 NOTE — Telephone Encounter (Signed)
 Pt advised and voices understanding.  He said he found some predisone 10mg . Pt states it is not expired. I informed PCP and he said he can take 30mg  for three days. If he is not feeling better, pt has been informed he needs an appointment.

## 2024-12-18 NOTE — Telephone Encounter (Signed)
 FYI Only or Action Required?: Action required by provider: clinical question for provider. Advised ED. Declines. Would like advice from PCP. Pt would only be willing to come in for appt if it means he could be prescribed something for the pain or get an injection. Does not want to come in if it's just to confirm what he has. Please call pt.  Patient was last seen in primary care on 12/03/2024 by Rothfuss, Lang DASEN, PA-C.  Called Nurse Triage reporting Leg Pain.  Symptoms began about a month ago.  Interventions attempted: OTC medications: ibuprofen and tylenol .  Symptoms are: gradually worsening.  Triage Disposition: Go to ED Now (Notify PCP)  Patient/caregiver understands and will follow disposition?: No, wishes to speak with PCP    Pt with hx of gout, arthritis, and alpha gal syndrome. 1 month ago onset of joint/muscle pain in both legs from mid thighs down to feet.  Mainly in the knees. Worse over the past week. 10/10 constant pain  Reports similar symptoms to when he was first diagnosed with alpha gal. No recent strain or injury. No recent tick bites. No CP or SOB. No fever. Mild swelling. Severe difficulty walking, only gets up to use the BR sing FWW to have a BM. Pain is so severe that he will throw up. Prior to this pt was able to get up and walk around decent with cane.   Advised ED, declines states they can't do anything for him. Would like advice from PCP. Pt  would only be willing to come in for appt if it means he could be prescribed something for the pain or get an injection. Does not want to come in if it's just to confirm what he has. Called [facility] to report ED refusal, on lunch now. Forwarding request to office.    Can be sent to [facility] from [facility] L sent at 12/18/2024 12:13 PM EST  Reason for Triage: The patient has been bedridden for a week and believes his symptoms may be related to 'Alphagan syndrome.' He reports pain in his lower body (legs and feet), is  unable to perform daily activities, and feels nauseous when standing. Patient is seeking advice.   Reason for Disposition  Unable to walk  Answer Assessment - Initial Assessment Questions 1. ONSET: When did the pain start?      1 month ago  2. LOCATION: Where is the pain located?      Both legs from thighs down to the feet. Mostly in the knees.   3. PAIN: How bad is the pain?    (Scale 1-10; or mild, moderate, severe)     Severe 10/10, constant  4. WORK OR EXERCISE: Has there been any recent work or exercise that involved this part of the body?      No  5. CAUSE: What do you think is causing the leg pain?     Recurrence of alpha gal  6. OTHER SYMPTOMS: Do you have any other symptoms? (e.g., chest pain, back pain, breathing difficulty, swelling, rash, fever, numbness, weakness)     Barely able to walk with walker. Causes severe pain that causes pt to throw up.  Protocols used: Leg Pain-A-AH

## 2024-12-21 NOTE — Telephone Encounter (Signed)
 The patient was mailed a letter on 12/16/24.  I left a reminder voicemail today to call [facility] for ultrasound and [facility] for consult for EGD. The office numbers were provided. This information was also texted to him through [employer]. Additionally, I spoke to his brother (approved contact) who will reach out to the patient and provide our message.

## 2024-12-23 ENCOUNTER — Other Ambulatory Visit (HOSPITAL_BASED_OUTPATIENT_CLINIC_OR_DEPARTMENT_OTHER): Payer: Self-pay | Admitting: Family Medicine

## 2024-12-23 ENCOUNTER — Other Ambulatory Visit: Payer: Self-pay

## 2024-12-23 ENCOUNTER — Telehealth (HOSPITAL_BASED_OUTPATIENT_CLINIC_OR_DEPARTMENT_OTHER): Payer: Self-pay | Admitting: *Deleted

## 2024-12-23 DIAGNOSIS — F1020 Alcohol dependence, uncomplicated: Secondary | ICD-10-CM

## 2024-12-23 DIAGNOSIS — K7030 Alcoholic cirrhosis of liver without ascites: Secondary | ICD-10-CM

## 2024-12-23 NOTE — Patient Instructions (Signed)
 Visit Information  Thank you for taking time to visit with me today. Please don't hesitate to contact me if I can be of assistance to you before our next scheduled telephone appointment.  An telephone outreach has been scheduled for: 12/30/2024 around 3 pm.   Following is a copy of your care plan:   Goals Addressed             This Visit's Progress    VBCI Transitions of Care (TOC) Care Plan   On track    Reviewed with patient on 12/23/2024 call.   Problems:  Recent Hospitalization for treatment of HTN and ETOH withdraw, thrombocytopenia, pancytopenia Knowledge Deficit Related to discharge diagnosis (plural), Limited social support: No one to talk to about issues, concerns, No Hospital Follow Up Provider appointment but patient will call to make hospital follow up after rationale discussed and after he feels up to driving, and ETOH Alcohol withdraw needs and resources.   Goal:  Over the next 30 days, the patient will not experience hospital readmission  Interventions:   Transitions of Care: DC Summary reviewed with patient including medications, discharge instructions, what to call provider for, follow up appointments, need for PCP HFU.  HFU was canceled due to weather/road conditions, patient will call next week to reschedule.  Health Screening education provided Doctor Visits  - discussed the importance of doctor visits Post discharge activity limitations prescribed by provider reviewed Patient reports feeling much better this week.  Risk of withdrawal symptoms reviewed:  Combining alcohol with prescribed medications, especially naltrexone, acamprosate, disulfiram, or other CNS-active meds. Risk of overdose if using alcohol with opioids or sedatives Patient feeling better this week, much better, may not return to [facility], to discuss with PCP on HFU.   Health Maintenance Interventions: Patient interviewed about adult health maintenance status including  Depression screen     Falls risk assessment    Pneumonia Vaccine Regular eye checkups Blood Pressure    Advised to discuss  Depression screen    Falls risk assessment    Pneumonia Vaccine Blood Pressure    Discuss care gaps, nutritional, vitamins needs, and any alcohol abstaining resources with primary care provider    Anemia/Bleeding Interventions:  Assessment of understanding of anemia/bleeding disorder diagnosis  Basic overview and discussion of anemia/bleeding disorder or acute disease state  Medications reviewed  Counseled on bleeding risk associated with pancytopenia and importance of self-monitoring for signs/symptoms of bleeding Counseled on avoidance of NSAIDs due to increased bleeding risk Provided education about signs and symptoms of active bleeding such as stomach discomfort, coughing up blood or blood tinged secretions, bleeding from the gums/teeth, nosebleeds, increased bruising, blood in the urine/stool and/or if a traumatic injury occurs, regardless of severity of injury  Advised to call provider or 911 if active bleeding or signs and symptoms of active bleeding occur recommended promotion of rest and energy-conserving measures to manage fatigue, such as balancing activity with periods of rest encouraged strategies to prevent falls related to fatigue, weakness and dizziness; encouraged sitting before standing and using an assistive device encouraged optimal oral intake to support fluid balance and nutrition encouraged dietary changes to increase dietary intake of iron, Vitamin B12 and folic acid  as advised/prescribed Screening for signs and symptoms of depression related to chronic disease state Assessed social determinant of health barriers  Lab Results  Component Value Date   WBC 5.2 12/03/2024   HGB 15.4 12/03/2024   HCT 45.8 12/03/2024   MCV 93 12/03/2024   PLT 78 (LL)  12/03/2024   Falls Interventions: Provided written and verbal education re: potential causes of falls and Fall  prevention strategies Reviewed medications and discussed potential side effects of medications such as dizziness and frequent urination Advised patient of importance of notifying provider of falls Assessed for falls since last encounter Assessed patients knowledge of fall risk prevention secondary to previously provided education Screening for signs and symptoms of depression related to chronic disease state  Assessed social determinant of health barriers  Patient Self Care Activities:   Attend all scheduled provider appointments Abstain from alcohol use taking one day at a time as success.  Follow discharge instructions reviewed from DC Summary on this call.  Report falls to provider, document blood pressures daily.  Fall preventatives:  Use cane as needed when feeling weak or unstable.  Note when feeling the tingling in fingers and toes reported on this call and discuss with PCP HFU.  Note weakness associated with blood pressure or other symptoms, medications, ETOH use.  Consider Continuing seeing therapist at [facility] for alcohol substance issues/withdraw/inpatient treatment possibilities if desired.  Keep a log of triggers, cravings, use and associated issues, emotions, medications, habits or other associated issues to try to track when most likely to use or able to abstain from using ETOH.  Triggers: (stress, isolation, social settings). What are your coping strategies: (calling supports, distraction skills, grounding techniques). Safe people to contact when urges arise.  Keep a list with phone/contact information.  What to do if drinking escalates: Make a plan for yourself and others you want to assist you.  Brother.  Call pharmacy for medication refills 3-7 days in advance of running out of medications Call provider office for new concerns or questions  Notify RN Care Manager of TOC call rescheduling needs Participate in Transition of Care Program/Attend TOC scheduled  calls Perform all self care activities independently  Perform IADL's (shopping, preparing meals, housekeeping, managing finances) independently Take medications as prescribed   call the [place]  National Suicide Prevention Lifeline: (308) 071-3739 or TTY: (938)730-6948 TTY (508)611-5932) to talk to a trained counselor call 1-800-273-TALK (toll free, 24 hour hotline) if experiencing a Mental Health or Behavioral Health Crisis check blood pressure daily choose a place to take my blood pressure (home, clinic or office, retail store) write blood pressure results in a log or diary keep a blood pressure log take blood pressure log to all doctor appointments call doctor for signs and symptoms of high or low blood pressure; know signs of low blood pressure such as lightheadedness, dizziness, falls, fatigue, foggy thinking.  keep all doctor appointments take medications for blood pressure exactly as prescribed report new symptoms to your doctor eat more whole grains, fruits and vegetables, lean meats and healthy fats Stay hydrated with no sugary drinks, water is best.  Take multivitamin and B complex after discussing with PCP.  Note that B12 may need to be sublingual or under the tongue to bypass the stomach absorption issues or ask PCP about B12 injections if labwork shows you are low or prescribed by provider.   Plan:  An initial telephone outreach has been scheduled for: 12/30/2024 around 3 pm.  The patient will call PCP as advised to schedule post discharge hospital follow up or HFU.  Continue seeking counseling at [facility], consider 30 day inpatient treatment discussed as recommended by PCP after PCP HFU and feeling better.  Keep a log of triggers, cravings, use and associated issues, emotions, medications, habits or other associated issues to try to track when  most likely to use or able to abstain from using ETOH.         Patient verbalizes understanding of instructions and care plan provided  today and agrees to view in MyChart. Active MyChart status and patient understanding of how to access instructions and care plan via MyChart confirmed with patient.     Telephone follow up appointment with care management team member scheduled for:  An telephone outreach has been scheduled for: 12/30/2024 around 3 pm.   Please call the care guide team at [phone] if you need to cancel or reschedule your appointment.   Please call the [place]  National Suicide Prevention Lifeline: 787-129-3453 or TTY: 540 720 3854 TTY 226 446 5547) to talk to a trained counselor call 1-800-273-TALK (toll free, 24 hour hotline) if you are experiencing a Mental Health or Behavioral Health Crisis or need someone to talk to.   Bing Edison MSN, RN RN [employer]  VBCI-Population Health Office Hours M-F [phone] Direct Dial: [phone] Main Phone [phone]  Fax: [phone] [email or web address]

## 2024-12-23 NOTE — Transitions of Care (Post Inpatient/ED Visit) (Signed)
 " Transition of Care week 2  Visit Note  12/23/2024  Name: Kenneth Trevino MRN: [id number]          DOB: 1961-07-28  Situation: Patient enrolled in [facility] 30-day program. Visit completed with patient by telephone.   Background:   Initial Transition Care Management Follow-up Telephone Call Discharge Date and Diagnosis: 12/11/24, ETOH Withdrawl   Past Medical History:  Diagnosis Date   ALC (alcoholic liver cirrhosis) [facility])    Hepatology referral pending   Alcoholic peripheral neuropathy    Alcoholism (HCC)    f/by [facility]   Anxiety    CKD (chronic kidney disease) stage 3, GFR 30-59 ml/min (HCC)    Colonoscopy refused    Depression    Former smoker    Minimal, light   Gait disorder    Gout    Hypertension    OSA (obstructive sleep apnea)    Rx declined   Osteoarthritis     Assessment: Patient Reported Symptoms: Cognitive Cognitive Status: Alert and oriented to person, place, and time, Insightful and able to interpret abstract concepts, Normal speech and language skills      Neurological Neurological Review of Symptoms: Numbness, Weakness Oher Neurological Symptoms/Conditions [RPT]: Both much improved this week per patient on this call. Tingling on in fingers and improved, now able to get around the house and take care of himself better than last week when he was feeling bad per patient. Neurological Management Strategies: Adequate rest, Coping strategies, Routine screening, Medication therapy Neurological Comment: Encoruaged to discuss with PCP on HFU  when rescheduled, including what vitamins are recommended/safe for kidneys but assist with ETOH chronic issues. Fall prevention: uses single point cane around the house when feeling weak.  HEENT HEENT Symptoms Reported: No symptoms reported      Cardiovascular Cardiovascular Symptoms Reported: No symptoms reported Does patient have uncontrolled Hypertension?: Yes Is patient checking Blood Pressure at home?: Yes  (148/73) Cardiovascular Management Strategies: Activity, Adequate rest, Coping strategies, Medical device, Medication therapy, Routine screening Cardiovascular Self-Management Outcome: 4 (good) Cardiovascular Comment: Denies chest pain or shortness of breath, reports feeling stronger and better this week.  Respiratory Respiratory Symptoms Reported: No symptoms reported Respiratory Self-Management Outcome: 4 (good)  Endocrine Endocrine Symptoms Reported: No symptoms reported Is patient diabetic?: No Endocrine Comment: Only fingers tingle after a shower now.  Gastrointestinal        Genitourinary Genitourinary Symptoms Reported: No symptoms reported Additional Genitourinary Details: States he is not having issues at this time, reviewed staying hydrated, report any falls, dizziness, blood in  urine, dark stools, and discharge instructions. Genitourinary Management Strategies: Adequate rest, Fluid modification Genitourinary Self-Management Outcome: 4 (good)  Integumentary Integumentary Symptoms Reported: No symptoms reported    Musculoskeletal Musculoskelatal Symptoms Reviewed: Joint pain Additional Musculoskeletal Details: Complains of bilateral knee pain as a chronic condition ongoing. Musculoskeletal Management Strategies: Activity, Adequate rest, Coping strategies, Medical device, Medication therapy, Routine screening Falls in the past year?: Yes Number of falls in past year: 1 or less Was there an injury with Fall?: Yes Fall Risk Category Calculator: 2 Patient Fall Risk Level: Moderate Fall Risk Patient at Risk for Falls Due to: History of fall(s) Fall risk Follow up: Falls prevention discussed  Psychosocial Psychosocial Symptoms Reported: No symptoms reported Additional Psychological Details: Patient stated he is doing much better all around this week, stated he is doing okay on abstinence and does not  know if he will return to [facility] as he feels better than ever before,  encouraged to  discuss with PCP on HFU. Behavioral Management Strategies: Activity, Abstinence from substances, Adequate rest, Coping strategies, Support system Behavioral Health Self-Management Outcome: 4 (good) Techniques to Cope with Loss/Stress/Change: Counseling, Diversional activities, Medication Quality of Family Relationships: helpful Do you feel physically threatened by others?: No   Today's Vitals   12/23/24 1608  BP: (!) 148/73   Pain Scale: 0-10 Pain Score: 5  Pain Type: Chronic pain Pain Location: Knee Pain Orientation: Right, Left Pain Descriptors / Indicators: Aching Patients Stated Pain Goal: 3 Pain Intervention(s): Medication (See eMAR), Distraction  Medications Reviewed Today     Reviewed by Carolee Heron NOVAK, RN (Case Manager) on 12/23/24 at 1621  Med List Status: <None>   Medication Order Taking? Sig Documenting Provider Last Dose Status Informant  acamprosate (CAMPRAL) 333 MG tablet [id number] Yes Take 666 mg by mouth 3 (three) times daily. Does not take when using. [provider]  Active   albuterol  (VENTOLIN  HFA) 108 (90 Base) MCG/ACT inhaler [id number] Yes Inhale 2 puffs into the lungs every 4 (four) hours as needed for wheezing or shortness of breath. [provider]  Active   Allopurinol 200 MG TABS [id number] Yes allopurinol 1 PO QD for gout [provider]  Active   amLODipine -valsartan (EXFORGE) 10-160 MG tablet [id number] Yes amlodipine  and Valsartan 5mg /160mg  1 PO QD bedtime [provider]  Active            Med Note SYDELL, BING NOVAK Kitchens Dec 14, 2024  4:23 PM) Noted with patient this was not on discharge medications list, but losartan potassium 25 mg daily was and he had both medication on hand.   chlordiazePOXIDE (LIBRIUM) 25 MG capsule [id number]  Take by mouth.  Patient not taking: Reported on 12/23/2024   [provider]  Active   cloNIDine  (CATAPRES ) 0.1 MG tablet [id number] Yes Take 1 tablet (0.1 mg total) by  mouth 2 (two) times daily. Rothfuss, Jacob T, PA-C  Active   colchicine  0.6 MG tablet [id number] Yes Take 1 tablet (0.6 mg total) by mouth 2 (two) times daily. Dottie Norleen [name], MD  Active   folic acid  (FOLVITE ) 1 MG tablet [id number] Yes Take 1 mg by mouth daily. [provider]  Active   losartan (COZAAR) 25 MG tablet [id number] Yes Take 25 mg by mouth daily. [provider]  Active            Med Note SYDELL, BING NOVAK Kitchens Dec 14, 2024  4:26 PM) Reviewed medication list with patient and discussed this was on discharge medications instead of the amlodipine  he had been taking. Discussed importance of follow up with PCP for post discharge medication review as well and to call regarding these two medication he had on hand and to take blood pressure daily.   naltrexone (DEPADE) 50 MG tablet [id number]  Take 50 mg by mouth at bedtime.  Patient not taking: Reported on 12/23/2024   [provider]  Active            Med Note SYDELL, BING NOVAK   Wed Dec 23, 2024  4:21 PM) Does not feel it helps.   potassium chloride  (KLOR-CON ) 20 MEQ packet [id number] Yes daily. as directed [provider]  Active   sucralfate (CARAFATE) 1 GM/10ML suspension [id number] Yes TAKE 1 GM ( ) BY MOUTH BEFORE MEALS AS NEEDED AT BEDTIME MAY SUBSTITUTE FOR TABLETS IF CHEAPER [provider]  Active   thiamine  (VITAMIN B1) 100 MG tablet [id number]  Yes TAKE 1 TABLET BY MOUTH EVERY DAY AT 12 PM NOON [provider]  Active            Med Note SYDELL, BING NOVAK   Wed Dec 23, 2024  4:21 PM) When available.             Goals Addressed             This Visit's Progress    [facility] Transitions of Care (TOC) Care Plan   On track    Reviewed with patient on 12/23/2024 call.   Problems:  Recent Hospitalization for treatment of HTN and ETOH withdraw, thrombocytopenia, pancytopenia Knowledge Deficit Related to discharge diagnosis (plural), Limited social support: No one to talk to about  issues, concerns, No Hospital Follow Up Provider appointment but patient will call to make hospital follow up after rationale discussed and after he feels up to driving, and ETOH Alcohol withdraw needs and resources.   Goal:  Over the next 30 days, the patient will not experience hospital readmission  Interventions:   Transitions of Care: DC Summary reviewed with patient including medications, discharge instructions, what to call provider for, follow up appointments, need for PCP HFU.  HFU was canceled due to weather/road conditions, patient will call next week to reschedule.  Health Screening education provided Doctor Visits  - discussed the importance of doctor visits Post discharge activity limitations prescribed by provider reviewed Patient reports feeling much better this week.  Risk of withdrawal symptoms reviewed:  Combining alcohol with prescribed medications, especially naltrexone, acamprosate, disulfiram, or other CNS-active meds. Risk of overdose if using alcohol with opioids or sedatives Patient feeling better this week, much better, may not return to [facility], to discuss with PCP on HFU.   Health Maintenance Interventions: Patient interviewed about adult health maintenance status including  Depression screen    Falls risk assessment    Pneumonia Vaccine Regular eye checkups Blood Pressure    Advised to discuss  Depression screen    Falls risk assessment    Pneumonia Vaccine Blood Pressure    Discuss care gaps, nutritional, vitamins needs, and any alcohol abstaining resources with primary care provider    Anemia/Bleeding Interventions:  Assessment of understanding of anemia/bleeding disorder diagnosis  Basic overview and discussion of anemia/bleeding disorder or acute disease state  Medications reviewed  Counseled on bleeding risk associated with pancytopenia and importance of self-monitoring for signs/symptoms of bleeding Counseled on avoidance of NSAIDs due to  increased bleeding risk Provided education about signs and symptoms of active bleeding such as stomach discomfort, coughing up blood or blood tinged secretions, bleeding from the gums/teeth, nosebleeds, increased bruising, blood in the urine/stool and/or if a traumatic injury occurs, regardless of severity of injury  Advised to call provider or 911 if active bleeding or signs and symptoms of active bleeding occur recommended promotion of rest and energy-conserving measures to manage fatigue, such as balancing activity with periods of rest encouraged strategies to prevent falls related to fatigue, weakness and dizziness; encouraged sitting before standing and using an assistive device encouraged optimal oral intake to support fluid balance and nutrition encouraged dietary changes to increase dietary intake of iron, Vitamin B12 and folic acid  as advised/prescribed Screening for signs and symptoms of depression related to chronic disease state Assessed social determinant of health barriers  Lab Results  Component Value Date   WBC 5.2 12/03/2024   HGB 15.4 12/03/2024   HCT 45.8 12/03/2024   MCV 93 12/03/2024   PLT 78 (LL)  12/03/2024   Falls Interventions: Provided written and verbal education re: potential causes of falls and Fall prevention strategies Reviewed medications and discussed potential side effects of medications such as dizziness and frequent urination Advised patient of importance of notifying provider of falls Assessed for falls since last encounter Assessed patients knowledge of fall risk prevention secondary to previously provided education Screening for signs and symptoms of depression related to chronic disease state  Assessed social determinant of health barriers  Patient Self Care Activities:   Attend all scheduled provider appointments Abstain from alcohol use taking one day at a time as success.  Follow discharge instructions reviewed from DC Summary on this call.   Report falls to provider, document blood pressures daily.  Fall preventatives:  Use cane as needed when feeling weak or unstable.  Note when feeling the tingling in fingers and toes reported on this call and discuss with PCP HFU.  Note weakness associated with blood pressure or other symptoms, medications, ETOH use.  Consider Continuing seeing therapist at [facility] for alcohol substance issues/withdraw/inpatient treatment possibilities if desired.  Keep a log of triggers, cravings, use and associated issues, emotions, medications, habits or other associated issues to try to track when most likely to use or able to abstain from using ETOH.  Triggers: (stress, isolation, social settings). What are your coping strategies: (calling supports, distraction skills, grounding techniques). Safe people to contact when urges arise.  Keep a list with phone/contact information.  What to do if drinking escalates: Make a plan for yourself and others you want to assist you.  Brother.  Call pharmacy for medication refills 3-7 days in advance of running out of medications Call provider office for new concerns or questions  Notify RN Care Manager of TOC call rescheduling needs Participate in Transition of Care Program/Attend TOC scheduled calls Perform all self care activities independently  Perform IADL's (shopping, preparing meals, housekeeping, managing finances) independently Take medications as prescribed   call the [place]  National Suicide Prevention Lifeline: 404-779-5505 or TTY: (747) 864-9797 TTY (574)246-3949) to talk to a trained counselor call 1-800-273-TALK (toll free, 24 hour hotline) if experiencing a Mental Health or Behavioral Health Crisis check blood pressure daily choose a place to take my blood pressure (home, clinic or office, retail store) write blood pressure results in a log or diary keep a blood pressure log take blood pressure log to all doctor appointments call doctor for signs  and symptoms of high or low blood pressure; know signs of low blood pressure such as lightheadedness, dizziness, falls, fatigue, foggy thinking.  keep all doctor appointments take medications for blood pressure exactly as prescribed report new symptoms to your doctor eat more whole grains, fruits and vegetables, lean meats and healthy fats Stay hydrated with no sugary drinks, water is best.  Take multivitamin and B complex after discussing with PCP.  Note that B12 may need to be sublingual or under the tongue to bypass the stomach absorption issues or ask PCP about B12 injections if labwork shows you are low or prescribed by provider.   Plan:  An telephone outreach has been scheduled for: 12/30/2024 around 3 pm.  The patient will call PCP as advised to schedule post discharge hospital follow up or HFU.  Continue seeking [facility] at [facility], consider 30 day inpatient treatment discussed as recommended by PCP after PCP HFU and feeling better.  Keep a log of triggers, cravings, use and associated issues, emotions, medications, habits or other associated issues to try to track when most  likely to use or able to abstain from using ETOH.       The patient has been provided with contact information for the care management team and has been advised to call with any health-related questions or concerns.  The patient verbalized understanding with current POC.  The patient is directed to their insurance card regarding availability of benefits coverage    Recommendation:   Continue Current Plan of Care Reschedule all follow up appointments that were canceled due to weather conditions.   Follow Up Plan:   Telephone follow-up in 1 week An telephone outreach has been scheduled for: 12/30/2024 around 3 pm.   Bing Edison MSN, RN RN Case Manager [facility]  [facility]-Population Health Office Hours M-F [phone] Direct Dial: [phone] Main Phone [phone]  Fax: [phone] [email or web address]        "

## 2024-12-25 ENCOUNTER — Other Ambulatory Visit: Payer: Self-pay

## 2024-12-25 ENCOUNTER — Ambulatory Visit (HOSPITAL_BASED_OUTPATIENT_CLINIC_OR_DEPARTMENT_OTHER): Payer: Self-pay | Admitting: Family Medicine

## 2024-12-25 NOTE — Telephone Encounter (Signed)
 FYI Only or Action Required?: Action required by provider: Appointment scheduled 12/28/24, requesting prednisone in mean time..  Patient was last seen in primary care on 12/03/2024 by Rothfuss, Lang DASEN, PA-C.  Called Nurse Triage reporting Leg Pain.  Symptoms began yesterday.  Interventions attempted: Nothing.  Symptoms are: gradually worsening.  Triage Disposition: See PCP When Office is Open (Within 3 Days)  Patient/caregiver understands and will follow disposition?: Yes   Reason for Disposition  [1] MODERATE pain (e.g., interferes with normal activities, limping) AND [2] present > 3 days  Answer Assessment - Initial Assessment Questions Pt reports calling in 12/18/24, had some prednisone at home and was advised to take it, and states it took the pain away entirely. Reports before taking the prednisone, the pain was so bad he vomited just trying to walk to the bathroom. Appointment scheduled with PCP, patient is requesting prednisone be sent in to [facility] in the mean time or else he won't be able to come in for his appointment on Monday. Please advise. Requesting call back [phone].   1. ONSET: When did the pain start?      Yesterday  2. LOCATION: Where is the pain located?      Left knee  3. PAIN: How bad is the pain?    (Scale 1-10; or mild, moderate, severe)     8-9/10  4. CAUSE: What do you think is causing the leg pain?     Unsure, Pt with hx of gout, arthritis, and alpha gal syndrome. 1  5. OTHER SYMPTOMS: Do you have any other symptoms? (e.g., chest pain, back pain, breathing difficulty, swelling, rash, fever, numbness, weakness)     Denies  Protocols used: Leg Pain-A-AH  Message from Moselle S sent at 12/25/2024  9:39 AM EST  Summary: Leg Pain & Medicarion Refill   Reason for Triage: patient currently experiencing leg pain due to being without prednisone previous seen for leg pain and was prescribed this  medication but not he is out has an upcoming appt but patient states w/o medication is wont be able to make any upcoming appt and also would not be able to  walk to the bathroom due to the severity of the pain w/o medication

## 2024-12-28 ENCOUNTER — Ambulatory Visit (HOSPITAL_BASED_OUTPATIENT_CLINIC_OR_DEPARTMENT_OTHER): Admitting: Family Medicine

## 2024-12-29 ENCOUNTER — Inpatient Hospital Stay (HOSPITAL_BASED_OUTPATIENT_CLINIC_OR_DEPARTMENT_OTHER): Admission: RE | Admit: 2024-12-29 | Admitting: Radiology

## 2024-12-30 ENCOUNTER — Telehealth
